# Patient Record
Sex: Female | Born: 1965 | Race: White | Hispanic: No | Marital: Married | Smoking: Never smoker
Health system: Southern US, Community
[De-identification: ages and names within clinical notes are randomized; demographics above are authoritative.]

## PROBLEM LIST (undated history)

## (undated) DIAGNOSIS — R001 Bradycardia, unspecified: Secondary | ICD-10-CM

## (undated) DIAGNOSIS — F419 Anxiety disorder, unspecified: Secondary | ICD-10-CM

## (undated) DIAGNOSIS — F32A Depression, unspecified: Secondary | ICD-10-CM

## (undated) DIAGNOSIS — F329 Major depressive disorder, single episode, unspecified: Secondary | ICD-10-CM

## (undated) HISTORY — PX: DILATION AND CURETTAGE OF UTERUS: SHX78

---

## 2004-10-27 ENCOUNTER — Other Ambulatory Visit: Admission: RE | Admit: 2004-10-27 | Discharge: 2004-10-27 | Payer: Self-pay | Admitting: Obstetrics and Gynecology

## 2005-02-14 ENCOUNTER — Ambulatory Visit (HOSPITAL_COMMUNITY): Admission: RE | Admit: 2005-02-14 | Discharge: 2005-02-14 | Payer: Self-pay | Admitting: Obstetrics and Gynecology

## 2005-02-14 ENCOUNTER — Encounter (INDEPENDENT_AMBULATORY_CARE_PROVIDER_SITE_OTHER): Payer: Self-pay | Admitting: Specialist

## 2006-05-03 ENCOUNTER — Ambulatory Visit (HOSPITAL_COMMUNITY): Admission: RE | Admit: 2006-05-03 | Discharge: 2006-05-03 | Payer: Self-pay | Admitting: Obstetrics and Gynecology

## 2006-05-18 ENCOUNTER — Encounter: Admission: RE | Admit: 2006-05-18 | Discharge: 2006-05-18 | Payer: Self-pay | Admitting: Obstetrics and Gynecology

## 2006-07-13 ENCOUNTER — Ambulatory Visit (HOSPITAL_COMMUNITY): Admission: RE | Admit: 2006-07-13 | Discharge: 2006-07-13 | Payer: Self-pay | Admitting: Obstetrics and Gynecology

## 2006-08-08 ENCOUNTER — Ambulatory Visit: Payer: Self-pay | Admitting: Gastroenterology

## 2006-10-02 ENCOUNTER — Ambulatory Visit: Payer: Self-pay | Admitting: Gastroenterology

## 2007-05-21 ENCOUNTER — Ambulatory Visit (HOSPITAL_COMMUNITY): Admission: RE | Admit: 2007-05-21 | Discharge: 2007-05-21 | Payer: Self-pay | Admitting: Obstetrics and Gynecology

## 2009-03-20 ENCOUNTER — Emergency Department (HOSPITAL_COMMUNITY): Admission: EM | Admit: 2009-03-20 | Discharge: 2009-03-20 | Payer: Self-pay | Admitting: Emergency Medicine

## 2010-04-17 ENCOUNTER — Encounter: Payer: Self-pay | Admitting: Obstetrics and Gynecology

## 2010-06-27 LAB — URINALYSIS, ROUTINE W REFLEX MICROSCOPIC
Hgb urine dipstick: NEGATIVE
Ketones, ur: 15 mg/dL — AB
Leukocytes, UA: NEGATIVE
Nitrite: NEGATIVE
Specific Gravity, Urine: 1.02 (ref 1.005–1.030)
Urobilinogen, UA: 1 mg/dL (ref 0.0–1.0)

## 2010-06-27 LAB — CBC
RBC: 4.43 MIL/uL (ref 3.87–5.11)
RDW: 13 % (ref 11.5–15.5)
WBC: 7.6 10*3/uL (ref 4.0–10.5)

## 2010-06-27 LAB — COMPREHENSIVE METABOLIC PANEL
ALT: 22 U/L (ref 0–35)
Albumin: 4.1 g/dL (ref 3.5–5.2)
BUN: 15 mg/dL (ref 6–23)
CO2: 26 mEq/L (ref 19–32)
Calcium: 8.8 mg/dL (ref 8.4–10.5)
Chloride: 103 mEq/L (ref 96–112)
GFR calc Af Amer: >60 mL/min (ref >60)
GFR calc non Af Amer: >60 mL/min (ref >60)
Glucose, Bld: 110 mg/dL — ABNORMAL HIGH (ref 70–99)
Potassium: 3.6 mEq/L (ref 3.5–5.1)
Sodium: 136 mEq/L (ref 135–145)

## 2010-06-27 LAB — DIFFERENTIAL
Basophils Relative: 1 % (ref 0–1)
Eosinophils Relative: 1 % (ref 0–5)
Lymphs Abs: 0.2 10*3/uL — ABNORMAL LOW (ref 0.7–4.0)
Monocytes Absolute: 0.2 10*3/uL (ref 0.1–1.0)
Monocytes Relative: 2 % — ABNORMAL LOW (ref 3–12)
Neutro Abs: 7.1 10*3/uL (ref 1.7–7.7)

## 2010-06-27 LAB — URINE MICROSCOPIC-ADD ON

## 2010-08-09 NOTE — Assessment & Plan Note (Signed)
[facility]                         GASTROENTEROLOGY OFFICE NOTE   MANDE, AUVIL                          MRN:          [id number]  DATE:08/08/2006                            DOB:          28-Feb-1966    NEW GI PATIENT VISIT   The patient was self-referred.   PRIMARY CARE PHYSICIAN:  Dr. Corliss Blacker at [facility].   CHIEF COMPLAINT:  Chronic GERD, chronic throat-clearing.   HISTORY OF PRESENT ILLNESS:  Ms. Wanat is a very pleasant 45 year old  who has had several years of problems with pyrosis and slight acid  regurgitation.  She feels a burning in her chest that occurs 5 to 6  times a week at least.  SHE also has chronic throat-clearing.  This was  initially an issue 2 to 3 years ago.  She was evaluated by ENT when she  lived in [place], who told her it may be acid related.  She believes she  was on Protonix in the past with some relief.  Currently, she is taking  OTC Prilosec on an as needed basis probably 4 to 5 times a week.  She  has no dysphagia.  No overt gastrointestinal bleeding, and her weight  has been stable.   REVIEW OF SYSTEMS:  Essentially normal and is available on our nursing  intake sheet.   PAST MEDICAL HISTORY:  None.   CURRENT MEDICATIONS:  Zyrtec and Prilosec on a p.r.n. basis.   ALLERGIES:  PENICILLIN.   SOCIAL HISTORY:  Married, 2 children, ages 67 and 47.  Nonsmoker.  Drinks  approximately 3 to 4 cups of cafe beverages a day, as well as peppermint  candy, peppermint gum all day throughout the day.   FAMILY HISTORY:  No colon cancer, colon polyps in the family.   PHYSICAL EXAM:  Height 5 feet 7 inches, 142 pounds.  Blood pressure  100/64, pulse 60.  CONSTITUTIONAL:  Generally well-appearing.  NEUROLOGIC:  Alert and oriented x3.  EYES:  Extraocular movements are intact.  MOUTH:  Oropharynx moist.  No lesions.  NECK:  Supple.  No lymphadenopathy.  CARDIOVASCULAR:  Regular rate and rhythm.  LUNGS:  Clear  to auscultation bilaterally.  ABDOMEN:  Soft, nontender, nondistended.  Normoactive bowel sounds.  EXTREMITIES:  No lower extremity edema.  SKIN:  No rash or lesion on the visible extremities.   ASSESSMENT AND PLAN:  A 45 year old woman with chronic gastroesophageal  reflux disease, excess caffeine and peppermint intake.   I am most struck by the amount of peppermint gums and candies she eats  on a daily basis.  I have recommended she completely stop this for a  week straight.  If she notices no difference in her symptoms, then she  can obviously restart, but at that point she would also change the way  she is taking her over-the-counter Prilosec to 20-30 minutes prior to  her breakfast meal on a daily basis, rather than on an as needed basis  only.  I will also arrange for her to have an EGD performed at her  soonest convenience.  I did not  speak too much with her about caffeine  as a possible cause, but I am giving her a handout and we will discuss  that at the time of her EGD.     Rachael Fee, MD  Electronically Signed    DPJ/MedQ  DD: 08/08/2006  DT: 08/08/2006  Job #: [id number]   cc:   Pam Drown, M.D.

## 2010-08-12 NOTE — H&P (Signed)
NAME:  Nancy Walker, Nancy Walker NO.:  [id number]   MEDICAL RECORD NO.:  [id number]          PATIENT TYPE:  AMB   LOCATION:  SDC                           F[facility]ILITY:  [facility]   PHYSICIAN:  Huel Cote, M.D. DATE OF BIRTH:  [birth year]   DATE OF ADMISSION:  02/14/2005  DATE OF DISCHARGE:                                HISTORY & PHYSICAL   The patient is a 45 year old G2, P2, who is coming in for a laparoscopic  left cystectomy versus oophorectomy for a complex lesion which has been  present for approximately one year.  The patient has not had significant  symptoms from this; however, ultrasound follow-up over one year has shown no  resolution of this complex partially-solid lesion and given her age  approaching 53 and the possibility of defining this lesion which has grown  slightly throughout time, it was felt to be prudent to remove it for  pathologic diagnosis.   PAST MEDICAL HISTORY:  None.   PAST SURGICAL HISTORY:  C-section x2, last with a tubal ligation.   ALLERGIES:  PENICILLIN.   PAST OBSTETRICAL HISTORY:  C-section x2.   PAST GYNECOLOGIC HISTORY:  No abnormal Pap smears.   CURRENT MEDICATIONS:  Lo-Ovral and Avahist.   FAMILY HISTORY:  Significant for breast cancer in a maternal great-aunt.  There is no history of ovarian cancer.   PHYSICAL EXAMINATION:  VITAL SIGNS:  Her height is 5 feet 7 inches, weight  is 138 pounds.  Blood pressure 92/70.  CARDIAC:  Regular rate and rhythm.  LUNGS:  Clear.  ABDOMEN:  Soft and nontender.  PELVIC:  Her cervix has no lesions.  Uterus is normal in size, and the  adnexa have no palpable masses.   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  There is a very small percentage of these which  could have immature components, and this was discussed with the patient and  she was given the  option of expectant management versus removal and elects  for removal.  The risks and benefits of surgery were discussed in detail  with the patient, including bleeding, infection and possible damage to bowel  and bladder.  She understands that she may  need an oophorectomy should the lesion not be clearly identifiable to be  removed by cystectomy and has no problem with a possible oophorectomy on  that side.  We also discussed the incisions and procedure in detail, and she  is desirous of proceeding.      Huel Cote, M.D.  Electronically Signed     KR/MEDQ  D:  02/09/2005  T:  02/09/2005  Job:  [id number]

## 2010-08-12 NOTE — Op Note (Signed)
NAME:  Nancy Walker, Nancy Walker                 ACCOUNT NO.:  [id number]   MEDICAL RECORD NO.:  [id number]          PATIENT TYPE:  AMB   LOCATION:  SDC                           [facility]:  [facility]   PHYSICIAN:  Sherron Monday, MD        DATE OF BIRTH:  03-14-1966   DATE OF PROCEDURE:  07/13/2006  DATE OF DISCHARGE:                               OPERATIVE REPORT   PREOPERATIVE DIAGNOSIS:  Menorrhagia.   POSTOPERATIVE DIAGNOSIS:  Menorrhagia.   PROCEDURE:  NovaSure endometrial ablation.   SURGEON:  Sherron Monday, M.D.   ASSISTANT:  None.   ANESTHESIA:  General.   ESTIMATED BLOOD LOSS:  Minimal.   INTRAVENOUS FLUIDS:  1000 mL.   URINE OUTPUT:  By I&O catheterization prior to the procedure.   COMPLICATIONS:  None.   SPECIMENS:  None.   FINDINGS:  Uterus sounds to 8 cm, cervical length found to be 3 cm and  the width of the cavity 3.3 cm, and the NovaSure power was at 91 watts,  and her time was 1 minute and 54 seconds.   PROCEDURE:  Risks, benefits and alternatives of the surgical procedure  were discussed with the patient. She was seen and examined in the  office. She was transported to the operating room, placed on a table in  supine position. General anesthesia was induced and found to be  adequate. She was placed in the yellow-fin stirrups, then prepped and  draped in the normal sterile fashion. Using an open-sided speculum, her  cervix was easily identified using a single-tooth tenaculum. Cervix was  grasped. Uterus was sounded to approximately 8 cm. It was noted to be  retroverted, dilated to accommodate the NovaSure ablation device. The  cervical length was found to be 3 cm after placement of the device. The  width of the cavity was found to be 3.25 cm. The test of cavity  integrity was performed without difficulty. The ablation was then  performed 91 watts for approximately 1 minute and 54 seconds. She  tolerated the procedure well. The device was removed from her cervix.  The  single-toothed tenaculum was also removed. Using pressure,  hemostasis was obtained at the tenaculum site. Sponge, lap and needle  counts were correct x2 per the operating room staff. The patient was  awakened and taken in stable condition to the PACU.      Sherron Monday, MD  Electronically Signed     JB/MEDQ  D:  07/13/2006  T:  07/13/2006  Job:  [id number]

## 2010-08-12 NOTE — H&P (Signed)
NAME:  Nancy Walker, Nancy Walker                 ACCOUNT NO.:  [id number]   MEDICAL RECORD NO.:  [id number]          PATIENT TYPE:  AMB   LOCATION:  SDC                           [facility]   PHYSICIAN:  Sherron Monday, MD        DATE OF BIRTH:  09-Apr-1965   DATE OF ADMISSION:  07/13/2006  DATE OF DISCHARGE:                              HISTORY & PHYSICAL   ADMISSION PROCEDURE:  NovaSure endometrial ablation.   HISTORY OF PRESENT ILLNESS:  A 45 year old Caucasian female, G2 P2-0-0-2  with history of heavy menses who was interested in the NovaSure  ablation.  In our office, she had endometrial biopsy and saline infused  histogram revealing a normal cavity and the endometrial biopsy was  benign.   PAST MEDICAL HISTORY:  Significant for migraines.   PAST SURGICAL HISTORY:  Significant for left salpingo-oophorectomy  secondary to a dermoid tumor as well as two C sections as well as a  bilateral tubal ligation.   PAST OBSTETRICAL HISTORY:  She is a G2 P2-0-0-2 with two term cesarean  sections, the first of which was for failure to progress and the second  for repeat.  She has no history of abnormal pap smears and her last one  was in January 2008 and was within normal limits.  She has never had any  sexually transmitted diseases.   MEDICATIONS:  Protonix.   ALLERGIES:  No known drug allergies.   SOCIAL HISTORY:  She denies tobacco use. She has occasional alcohol use  and no drugs. She is married and works in [employer].   FAMILY HISTORY:  Significant for mother with Cashe's disease, no  hypertension, diabetes or cancer and no coronary artery disease.   PHYSICAL EXAMINATION:  VITAL SIGNS:  Afebrile.  Vital signs are stable.  She is approximately 5 feet 7 inches tall, weighs 143 pounds.  GENERAL:  She is in no distress.  CARDIOVASCULAR: Regular rate and rhythm.  LUNGS;  Clear to auscultation bilaterally.  NECK:  Without lymphadenopathy.  HEENT: Mucous membranes are moist. Thyroid is within  normal limits.  ABDOMEN:  Soft, nontender, nondistended, good bowel sounds.  EXTREMITIES:  Symmetric and nontender.  BREASTS:  No masses and nontender. No distortion.  BACK:  No costovertebral angle tenderness.  PELVIC:  Normal external female genitalia, normal BUS.  Cervix and  vagina without lesions.  No cervical motion tenderness, normal uterus  and ovaries on bilateral examination.   ASSESSMENT AND PLAN:  After the preoperative workup, we again discussed  the risks, benefits and alternatives of the surgical procedure. The  patient wishes to proceed.  Her questions are answered.  We will proceed  with a NovaSure endometrial ablation on July 13, 2006.      Sherron Monday, MD  Electronically Signed     JB/MEDQ  D:  07/12/2006  T:  07/12/2006  Job:  [id number]

## 2010-08-12 NOTE — Op Note (Signed)
NAME:  Nancy Walker, Nancy Walker NO.:  [id number]   MEDICAL RECORD NO.:  [id number]          PATIENT TYPE:  AMB   LOCATION:  SDC                           [facility]   PHYSICIAN:  Huel Cote, M.D. DATE OF BIRTH:  December 27, 1965   DATE OF PROCEDURE:  02/14/2005  DATE OF DISCHARGE:                                 OPERATIVE REPORT   PREOPERATIVE DIAGNOSIS:  Left complex ovarian mass, approximately 2 cm.   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   PROCEDURE:  Left salpingo-oophorectomy, laparoscopic lysis of anterior  uterine adhesion.   SURGEON:  Huel Cote, M.D.   ASSISTANT:  Zenaida Niece, M.D.   ANESTHESIA:  General.   SPECIMEN:  Left tube and ovary.   ESTIMATED BLOOD LOSS:  Minimal.   COMPLICATIONS:  None.   FINDINGS:  Left ovary was enlarged without a visible dermoid or mass noted  exteriorly.  The mass itself was embedded within normal ovarian tissue and  therefore could not be identified on the surface  The right ovary was  normal.  There was an anterior uterine adhesion, which was quite thick, to  the anterior abdominal wall.   PROCEDURE:  The patient was taken to the operating room, where general  anesthesia was obtained without difficulty.  She was then prepped and draped  in the normal sterile fashion in dorsal lithotomy position with a Hulka  tenaculum placed within the vagina on the cervix for uterine manipulation  and the bladder emptied.  Attention was then turned to the patient's  abdomen, where small infraumbilical incision was made with a scalpel  approximately 1.5 cm in length.  The Veress needle was then placed within  the incision easily and pneumoperitoneum obtained with approximately 1.5 L  of CO2 gas.  The Veress needle was then removed and the 10/11 trocar placed  within the incision without difficulty.  The camera was then introduced  and  the abdomen and pelvis were closely inspected.  Two additional sites were  then placed with 5-mm ports in both the right and lower left quadrant.  All  incision sites were injected with 0.5% Marcaine prior to placing the  trocars, and these were done under direct visualization with camera with  careful attention to avoid any vessels.  The uterus and ovaries were  inspected with the findings as previously stated, and the left adnexa was  then grasped and reflected medially with the infundibulopelvic ligament  easily exposed.  The infundibulopelvic ligament was then taken down with the  Ace harmonic scalpel and then taken across the uterine-ovarian ligaments to  totally remove the tube and ovary.  This area appeared hemostatic.  Therefore, attention was then turned to the anterior thick adhesion, which  ran from the anterior fundus to the abdominal wall.  This was taken down  with the scalpel as well with good hemostasis noted.  The right ovary and  tube appeared normal.  The liver edge and gallbladder appeared normal.  There was no active bleeding.  Therefore the camera was changed to the 5 mm  port site and the Endobag was introduced through the 10/11 trocar.  The left  tube and adnexa, which had been separated, were then placed into the Endobag  and removed through the 10/11 the trocar site.  This was then handed off for  frozen pathology given that there was no obvious answer to the question of  what pathology was present on physical exam.  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  At this point all instruments  and sponges were removed from the abdomen.  The trocars were removed under  direct visualization without any active bleeding noted intra-abdominally.  The trocar sites were then closed with 0 Vicryl in subcutaneous suture.  There was a small amount bleeding at the right 5 mm trocar site.  However,  this was controlled with a suture ligature  of subcutaneous tissue.  The skin  was then closed with 3-0 Vicryl in a subcuticular stitch.  The sponge, lap  and needle counts were correct x2 and the patient was taken to the recovery  room in stable condition.      Huel Cote, M.D.  Electronically Signed     KR/MEDQ  D:  02/14/2005  T:  02/14/2005  Job:  [id number]

## 2011-01-30 ENCOUNTER — Other Ambulatory Visit (HOSPITAL_COMMUNITY): Payer: Self-pay | Admitting: Obstetrics and Gynecology

## 2011-01-30 DIAGNOSIS — Z1231 Encounter for screening mammogram for malignant neoplasm of breast: Secondary | ICD-10-CM

## 2011-02-27 ENCOUNTER — Ambulatory Visit (HOSPITAL_COMMUNITY)
Admission: RE | Admit: 2011-02-27 | Discharge: 2011-02-27 | Disposition: A | Discharge status: Home / Self Care | Payer: 59 | Source: Ambulatory Visit | Attending: Obstetrics and Gynecology | Admitting: Obstetrics and Gynecology

## 2011-02-27 DIAGNOSIS — Z1231 Encounter for screening mammogram for malignant neoplasm of breast: Secondary | ICD-10-CM | POA: Insufficient documentation

## 2012-09-10 ENCOUNTER — Other Ambulatory Visit (HOSPITAL_COMMUNITY): Payer: Self-pay | Admitting: Obstetrics and Gynecology

## 2012-09-10 DIAGNOSIS — Z1231 Encounter for screening mammogram for malignant neoplasm of breast: Secondary | ICD-10-CM

## 2012-09-10 DIAGNOSIS — Z780 Asymptomatic menopausal state: Secondary | ICD-10-CM

## 2012-10-08 ENCOUNTER — Ambulatory Visit (HOSPITAL_COMMUNITY)
Admission: RE | Admit: 2012-10-08 | Discharge: 2012-10-08 | Disposition: A | Discharge status: Home / Self Care | Payer: BC Managed Care – PPO | Source: Ambulatory Visit | Attending: Obstetrics and Gynecology | Admitting: Obstetrics and Gynecology

## 2012-10-08 DIAGNOSIS — Z780 Asymptomatic menopausal state: Secondary | ICD-10-CM

## 2012-10-08 DIAGNOSIS — Z1231 Encounter for screening mammogram for malignant neoplasm of breast: Secondary | ICD-10-CM

## 2012-10-08 DIAGNOSIS — Z13820 Encounter for screening for osteoporosis: Secondary | ICD-10-CM | POA: Insufficient documentation

## 2013-11-18 ENCOUNTER — Other Ambulatory Visit (HOSPITAL_COMMUNITY): Payer: Self-pay | Admitting: Obstetrics and Gynecology

## 2013-11-18 DIAGNOSIS — Z1231 Encounter for screening mammogram for malignant neoplasm of breast: Secondary | ICD-10-CM

## 2013-11-27 ENCOUNTER — Ambulatory Visit (HOSPITAL_COMMUNITY)
Admission: RE | Admit: 2013-11-27 | Discharge: 2013-11-27 | Disposition: A | Discharge status: Home / Self Care | Payer: BC Managed Care – PPO | Source: Ambulatory Visit | Attending: Obstetrics and Gynecology | Admitting: Obstetrics and Gynecology

## 2013-11-27 DIAGNOSIS — Z1231 Encounter for screening mammogram for malignant neoplasm of breast: Secondary | ICD-10-CM | POA: Insufficient documentation

## 2014-12-15 ENCOUNTER — Other Ambulatory Visit (HOSPITAL_COMMUNITY): Payer: Self-pay | Admitting: Obstetrics and Gynecology

## 2014-12-15 DIAGNOSIS — Z1231 Encounter for screening mammogram for malignant neoplasm of breast: Secondary | ICD-10-CM

## 2014-12-17 ENCOUNTER — Ambulatory Visit (HOSPITAL_COMMUNITY): Payer: Self-pay

## 2014-12-23 ENCOUNTER — Ambulatory Visit (HOSPITAL_COMMUNITY)
Admission: RE | Admit: 2014-12-23 | Discharge: 2014-12-23 | Disposition: A | Discharge status: Home / Self Care | Payer: PRIVATE HEALTH INSURANCE | Source: Ambulatory Visit | Attending: Obstetrics and Gynecology | Admitting: Obstetrics and Gynecology

## 2014-12-23 DIAGNOSIS — Z1231 Encounter for screening mammogram for malignant neoplasm of breast: Secondary | ICD-10-CM | POA: Diagnosis not present

## 2015-06-17 ENCOUNTER — Other Ambulatory Visit: Payer: Self-pay | Admitting: Obstetrics and Gynecology

## 2015-06-17 DIAGNOSIS — M810 Age-related osteoporosis without current pathological fracture: Secondary | ICD-10-CM

## 2015-07-16 ENCOUNTER — Other Ambulatory Visit: Payer: PRIVATE HEALTH INSURANCE

## 2015-08-03 ENCOUNTER — Ambulatory Visit
Admission: RE | Admit: 2015-08-03 | Discharge: 2015-08-03 | Disposition: A | Discharge status: Home / Self Care | Payer: 59 | Source: Ambulatory Visit | Attending: Obstetrics and Gynecology | Admitting: Obstetrics and Gynecology

## 2015-08-03 DIAGNOSIS — M810 Age-related osteoporosis without current pathological fracture: Secondary | ICD-10-CM

## 2016-06-20 ENCOUNTER — Encounter: Payer: Self-pay | Admitting: Gastroenterology

## 2016-07-20 ENCOUNTER — Emergency Department (HOSPITAL_COMMUNITY): Payer: BLUE CROSS/BLUE SHIELD

## 2016-07-20 ENCOUNTER — Inpatient Hospital Stay (HOSPITAL_COMMUNITY)
Admission: EM | Admit: 2016-07-20 | Discharge: 2016-07-26 | DRG: 084 | Disposition: A | Discharge status: Home Health | Payer: BLUE CROSS/BLUE SHIELD | Attending: Neurosurgery | Admitting: Neurosurgery

## 2016-07-20 ENCOUNTER — Encounter (HOSPITAL_COMMUNITY): Payer: Self-pay | Admitting: Emergency Medicine

## 2016-07-20 DIAGNOSIS — S0291XA Unspecified fracture of skull, initial encounter for closed fracture: Secondary | ICD-10-CM

## 2016-07-20 DIAGNOSIS — F329 Major depressive disorder, single episode, unspecified: Secondary | ICD-10-CM | POA: Diagnosis present

## 2016-07-20 DIAGNOSIS — W109XXA Fall (on) (from) unspecified stairs and steps, initial encounter: Secondary | ICD-10-CM | POA: Diagnosis present

## 2016-07-20 DIAGNOSIS — S06310A Contusion and laceration of right cerebrum without loss of consciousness, initial encounter: Secondary | ICD-10-CM

## 2016-07-20 DIAGNOSIS — F419 Anxiety disorder, unspecified: Secondary | ICD-10-CM | POA: Diagnosis present

## 2016-07-20 DIAGNOSIS — S065X9A Traumatic subdural hemorrhage with loss of consciousness of unspecified duration, initial encounter: Secondary | ICD-10-CM | POA: Diagnosis present

## 2016-07-20 DIAGNOSIS — R402253 Coma scale, best verbal response, oriented, at hospital admission: Secondary | ICD-10-CM | POA: Diagnosis present

## 2016-07-20 DIAGNOSIS — Z91048 Other nonmedicinal substance allergy status: Secondary | ICD-10-CM | POA: Diagnosis not present

## 2016-07-20 DIAGNOSIS — S062X9A Diffuse traumatic brain injury with loss of consciousness of unspecified duration, initial encounter: Principal | ICD-10-CM | POA: Diagnosis present

## 2016-07-20 DIAGNOSIS — R402363 Coma scale, best motor response, obeys commands, at hospital admission: Secondary | ICD-10-CM | POA: Diagnosis present

## 2016-07-20 DIAGNOSIS — S0219XA Other fracture of base of skull, initial encounter for closed fracture: Secondary | ICD-10-CM | POA: Diagnosis present

## 2016-07-20 DIAGNOSIS — Z91040 Latex allergy status: Secondary | ICD-10-CM | POA: Diagnosis not present

## 2016-07-20 DIAGNOSIS — R4182 Altered mental status, unspecified: Secondary | ICD-10-CM | POA: Diagnosis present

## 2016-07-20 DIAGNOSIS — R402133 Coma scale, eyes open, to sound, at hospital admission: Secondary | ICD-10-CM | POA: Diagnosis present

## 2016-07-20 DIAGNOSIS — R9431 Abnormal electrocardiogram [ECG] [EKG]: Secondary | ICD-10-CM | POA: Diagnosis not present

## 2016-07-20 DIAGNOSIS — S06339A Contusion and laceration of cerebrum, unspecified, with loss of consciousness of unspecified duration, initial encounter: Secondary | ICD-10-CM | POA: Diagnosis present

## 2016-07-20 DIAGNOSIS — Z880 Allergy status to penicillin: Secondary | ICD-10-CM

## 2016-07-20 DIAGNOSIS — I619 Nontraumatic intracerebral hemorrhage, unspecified: Secondary | ICD-10-CM

## 2016-07-20 DIAGNOSIS — I629 Nontraumatic intracranial hemorrhage, unspecified: Secondary | ICD-10-CM

## 2016-07-20 DIAGNOSIS — R001 Bradycardia, unspecified: Secondary | ICD-10-CM | POA: Diagnosis present

## 2016-07-20 DIAGNOSIS — S06310S Contusion and laceration of right cerebrum without loss of consciousness, sequela: Secondary | ICD-10-CM | POA: Diagnosis not present

## 2016-07-20 DIAGNOSIS — S0633AA Contusion and laceration of cerebrum, unspecified, with loss of consciousness status unknown, initial encounter: Secondary | ICD-10-CM | POA: Diagnosis present

## 2016-07-20 HISTORY — DX: Anxiety disorder, unspecified: F41.9

## 2016-07-20 HISTORY — DX: Major depressive disorder, single episode, unspecified: F32.9

## 2016-07-20 HISTORY — DX: Depression, unspecified: F32.A

## 2016-07-20 LAB — CBC WITH DIFFERENTIAL/PLATELET
Basophils Absolute: 0 10*3/uL (ref 0.0–0.1)
Basophils Relative: 0 %
Eosinophils Absolute: 0 10*3/uL (ref 0.0–0.7)
Eosinophils Relative: 0 %
HEMATOCRIT: 39.3 % (ref 36.0–46.0)
HEMOGLOBIN: 13.4 g/dL (ref 12.0–15.0)
LYMPHS PCT: 11 %
Lymphs Abs: 0.8 10*3/uL (ref 0.7–4.0)
MCH: 30 pg (ref 26.0–34.0)
MCHC: 34.1 g/dL (ref 30.0–36.0)
MCV: 87.9 fL (ref 78.0–100.0)
Monocytes Absolute: 0.7 10*3/uL (ref 0.1–1.0)
Monocytes Relative: 10 %
NEUTROS ABS: 5.5 10*3/uL (ref 1.7–7.7)
NEUTROS PCT: 79 %
Platelets: 160 10*3/uL (ref 150–400)
RBC: 4.47 MIL/uL (ref 3.87–5.11)
RDW: 13.6 % (ref 11.5–15.5)
WBC: 7 10*3/uL (ref 4.0–10.5)

## 2016-07-20 LAB — BASIC METABOLIC PANEL
Anion gap: 10 (ref 5–15)
Anion gap: 8 (ref 5–15)
BUN: 22 mg/dL — AB (ref 6–20)
BUN: 20 mg/dL (ref 6–20)
CHLORIDE: 102 mmol/L (ref 101–111)
CO2: 28 mmol/L (ref 22–32)
CO2: 25 mmol/L (ref 22–32)
CREATININE: 0.68 mg/dL (ref 0.44–1.00)
Calcium: 9.3 mg/dL (ref 8.9–10.3)
Calcium: 8.6 mg/dL — ABNORMAL LOW (ref 8.9–10.3)
Chloride: 106 mmol/L (ref 101–111)
Creatinine, Ser: 0.6 mg/dL (ref 0.44–1.00)
GFR calc Af Amer: >60 mL/min (ref >60)
GFR calc Af Amer: >60 mL/min (ref >60)
GFR calc non Af Amer: >60 mL/min (ref >60)
GFR calc non Af Amer: >60 mL/min (ref >60)
Glucose, Bld: 126 mg/dL — ABNORMAL HIGH (ref 65–99)
Glucose, Bld: 111 mg/dL — ABNORMAL HIGH (ref 65–99)
Potassium: 3.8 mmol/L (ref 3.5–5.1)
Potassium: 4.1 mmol/L (ref 3.5–5.1)
SODIUM: 140 mmol/L (ref 135–145)
Sodium: 139 mmol/L (ref 135–145)

## 2016-07-20 LAB — MRSA PCR SCREENING: MRSA BY PCR: NEGATIVE

## 2016-07-20 LAB — CBC
HCT: 37.9 % (ref 36.0–46.0)
Hemoglobin: 12.5 g/dL (ref 12.0–15.0)
MCH: 29.4 pg (ref 26.0–34.0)
MCHC: 33 g/dL (ref 30.0–36.0)
MCV: 89.2 fL (ref 78.0–100.0)
Platelets: 157 10*3/uL (ref 150–400)
RBC: 4.25 MIL/uL (ref 3.87–5.11)
RDW: 13.4 % (ref 11.5–15.5)
WBC: 6.4 10*3/uL (ref 4.0–10.5)

## 2016-07-20 LAB — CBG MONITORING, ED: GLUCOSE-CAPILLARY: 119 mg/dL — AB (ref 65–99)

## 2016-07-20 LAB — I-STAT CG4 LACTIC ACID, ED
LACTIC ACID, VENOUS: 1.14 mmol/L (ref 0.5–1.9)
Lactic Acid, Venous: 1.13 mmol/L (ref 0.5–1.9)

## 2016-07-20 MED ORDER — ONDANSETRON HCL 4 MG/2ML IJ SOLN
4.0000 mg | Freq: Four times a day (QID) | INTRAMUSCULAR | Status: DC | PRN
Start: 2016-07-20 — End: 2016-07-26

## 2016-07-20 MED ORDER — ACETAMINOPHEN 650 MG RE SUPP: 650.0000 mg | Freq: Four times a day (QID) | RECTAL | Status: DC | PRN

## 2016-07-20 MED ORDER — MAGNESIUM CITRATE PO SOLN: 1.0000 | Freq: Once | ORAL | Status: DC | PRN

## 2016-07-20 MED ORDER — SODIUM CHLORIDE 0.9 % IV SOLN
INTRAVENOUS | Status: DC
  Administered 2016-07-21 – 2016-07-25 (×4): via INTRAVENOUS

## 2016-07-20 MED ORDER — DOCUSATE SODIUM 100 MG PO CAPS
100.0000 mg | ORAL_CAPSULE | Freq: Two times a day (BID) | ORAL | Status: DC
  Administered 2016-07-20 – 2016-07-25 (×5): 100 mg via ORAL
  Filled 2016-07-20 (×9): qty 1

## 2016-07-20 MED ORDER — BISACODYL 5 MG PO TBEC
5.0000 mg | DELAYED_RELEASE_TABLET | Freq: Every day | ORAL | Status: DC | PRN
  Filled 2016-07-20: qty 1

## 2016-07-20 MED ORDER — SODIUM CHLORIDE 0.9 % IV SOLN
500.0000 mg | Freq: Two times a day (BID) | INTRAVENOUS | Status: DC
  Administered 2016-07-20 – 2016-07-22 (×4): 500 mg via INTRAVENOUS
  Filled 2016-07-20 (×5): qty 5

## 2016-07-20 MED ORDER — SENNOSIDES-DOCUSATE SODIUM 8.6-50 MG PO TABS: 1.0000 | ORAL_TABLET | Freq: Every evening | ORAL | Status: DC | PRN

## 2016-07-20 MED ORDER — ACETAMINOPHEN 325 MG PO TABS
650.0000 mg | ORAL_TABLET | Freq: Four times a day (QID) | ORAL | Status: DC | PRN
  Administered 2016-07-23: 650 mg via ORAL
  Filled 2016-07-20 (×2): qty 2

## 2016-07-20 MED ORDER — ONDANSETRON HCL 4 MG PO TABS: 4.0000 mg | ORAL_TABLET | Freq: Four times a day (QID) | ORAL | Status: DC | PRN

## 2016-07-20 MED ORDER — HYDROCODONE-ACETAMINOPHEN 5-325 MG PO TABS: 1.0000 | ORAL_TABLET | ORAL | Status: DC | PRN

## 2016-07-20 MED ORDER — SODIUM CHLORIDE 0.9 % IV BOLUS (SEPSIS)
1000.0000 mL | Freq: Once | INTRAVENOUS | Status: AC
  Administered 2016-07-20: 1000 mL via INTRAVENOUS

## 2016-07-20 NOTE — H&P (Signed)
CC:  Chief Complaint  Patient presents with  . Loss of Consciousness  . Head Injury    HPI: Nancy Walker is a 51 y.o. female who was brought to ER earlier today by family due to increased fatigue after a fall Wednesday am. Husband present in room giving history. Reportedly she was "coming down with a cold" but she felt weak and fell hitting head. Since the fall, she has been sleeping throughout the day.  She cannot recall the fall, but remembers the events surrounding the event. Pt reports minimal frontal headache and generalized weakness. Denies dizziness, motor/sensory deficits.  PMH: History reviewed. No pertinent past medical history.  PSH: Past Surgical History:  Procedure Laterality Date  . CESAREAN SECTION      SH: Social History  Substance Use Topics  . Smoking status: Never Smoker  . Smokeless tobacco: Not on file  . Alcohol use No    MEDS: Prior to Admission medications   Not on File    ALLERGY: Allergies  Allergen Reactions  . Latex   . Penicillins   . Steri-Strip Compound Benzoin [Benzoin Compound]     ROS: Review of Systems  Constitutional: Negative for chills and fever.  HENT: Negative.  Negative for congestion, ear discharge, ear pain, hearing loss, nosebleeds, sinus pain, sore throat and tinnitus.   Eyes: Negative.   Respiratory: Positive for cough. Negative for stridor.   Cardiovascular: Negative.   Gastrointestinal: Positive for nausea (intermittently).  Genitourinary: Negative.   Musculoskeletal: Negative.   Skin: Negative.   Neurological: Positive for headaches. Negative for dizziness, tingling, tremors, sensory change, speech change, focal weakness, seizures and loss of consciousness.  Endo/Heme/Allergies: Negative.   Psychiatric/Behavioral: Negative.     Vitals:   07/20/16 1258 07/20/16 1303  BP:  (!) 93/53  Pulse:  61  Resp:  16  Temp: 98 F (36.7 C)    General appearance: Drowsy but easily awakened, NAD, WDWN Eyes:  PERRL Cardiovascular: Regular rate and rhythm without murmurs, rubs, gallops. No edema or variciosities. Distal pulses normal. Pulmonary: Clear to auscultation Musculoskeletal:     Muscle tone upper extremities: Normal    Muscle tone lower extremities: Normal    Motor exam: Upper Extremities Deltoid Bicep Tricep Grip  Right 5/5 5/5 5/5 5/5  Left 5/5 5/5 5/5 5/5   Lower Extremity IP Quad PF DF EHL  Right 5/5 5/5 5/5 5/5 5/5  Left 5/5 5/5 5/5 5/5 5/5   Neurological Oriented  Speech fluent, appropriate CNII: Visual fields normal CNIII/IV/VI: EOMI CNV: Facial sensation normal CNVII: Symmetric, normal strength CNVIII: Grossly normal CNIX: Normal palate movement CNXI: Trap and SCM strength normal CN XII: Tongue protrusion normal Sensation grossly intact to LT DTR: Normal  IMAGING: CT HEAD IMPRESSION: 1. Hemorrhagic contusions in the right frontal greater than right temporal lobes. Small amount of associated subdural and likely subarachnoid hemorrhage. 2. Nondisplaced left temporal bone fracture. Diastasis of the left lambdoid suture with small minimally depressed bone fragment. 3. Associated small left temporal epidural hematoma. 4. Thin subdural hematoma over the left cerebral convexity.  IMPRESSION: - 51 y.o. female with multiple lobe contusions, small amount of SDH, nondisplaced left temporal bone fracture s/p fall. She is drowsy but easily awakened and otherwise neurologically intact. No surgical intervention indicated. These injuries should heal over time. At this time, rec continue monitoring with neuro exams q 1 hour. No need for repeat CT scan based on injury roughly 36 hours ago unless her exam changes. Start Keppra for seizure prophylaxis  500mg  BID x7days.

## 2016-07-20 NOTE — ED Notes (Signed)
Notified [facility] for transfer.  Approx 1 hour for pick up.  MD aware and approved delay.

## 2016-07-20 NOTE — ED Provider Notes (Signed)
[facility] DEPT Provider Note   CSN: [id number] Arrival date & time: 07/20/16  1246     History   Chief Complaint Chief Complaint  Patient presents with  . Loss of Consciousness  . Head Injury    HPI Nancy Walker is a 51 y.o. female.  HPI Pt comes in with cc of  ams. PT has no medical hx. Husband reports that pt has been having URI for the past couple of days and feeling weak. Titus Dubin, she had a fall, and since then she has been more sleepy, so he decided to bring her to the ER. Pt reports that she fell because she was weak and lost balance. She has mild headache. Pt denies associated nausea, vomiting, seizures, loss of consciousness or new visual complains, numbness, dizziness. Pt feels that she is just weak all over.  History reviewed. No pertinent past medical history.  Patient Active Problem List   Diagnosis Date Noted  . Frontal lobe contusion ([place]) 07/20/2016    Past Surgical History:  Procedure Laterality Date  . CESAREAN SECTION      OB History    No data available       Home Medications    Prior to Admission medications   Medication Sig Start Date End Date Taking? Authorizing Provider  sertraline (ZOLOFT) 100 MG tablet Take 200 mg by mouth daily. 05/13/16  Yes Historical Provider, MD    Family History No family history on file.  Social History Social History  Substance Use Topics  . Smoking status: Never Smoker  . Smokeless tobacco: Not on file  . Alcohol use No     Allergies   Latex; Penicillins; and Steri-strip compound benzoin [benzoin compound]   Review of Systems Review of Systems  Constitutional: Positive for fatigue.  Hematological: Does not bruise/bleed easily.  All other systems reviewed and are negative.    Physical Exam Updated Vital Signs BP (!) 103/50   Pulse 66   Temp 98 F (36.7 C) (Oral)   Resp 18   SpO2 98%   Physical Exam  Constitutional: She is oriented to person, place, and time. She appears well-developed.    HENT:  Head: Normocephalic and atraumatic.  No nystagmus  Eyes: Conjunctivae and EOM are normal. Pupils are equal, round, and reactive to light.  Neck: Normal range of motion. Neck supple.  Cardiovascular: Normal rate, regular rhythm and normal heart sounds.   Pulmonary/Chest: Effort normal and breath sounds normal. No respiratory distress.  Abdominal: Soft. Bowel sounds are normal. She exhibits no distension. There is no tenderness. There is no rebound and no guarding.  Neurological: She is oriented to person, place, and time. No cranial nerve deficit. Coordination normal.  Cerebellar exam is normal (finger to nose) Sensory exam normal for bilateral upper and lower extremities - and patient is able to discriminate between sharp and dull. Motor exam is 4+/5 for upper and lower extremity Pt is somnolent, but arosuable   Skin: Skin is warm and dry.  Nursing note and vitals reviewed.    ED Treatments / Results  Labs (all labs ordered are listed, but only abnormal results are displayed) Labs Reviewed  BASIC METABOLIC PANEL - Abnormal; Notable for the following:       Result Value   Glucose, Bld 126 (*)    BUN 22 (*)    All other components within normal limits  BASIC METABOLIC PANEL - Abnormal; Notable for the following:    Glucose, Bld 111 (*)    Calcium  8.6 (*)    All other components within normal limits  CBG MONITORING, ED - Abnormal; Notable for the following:    Glucose-Capillary 119 (*)    All other components within normal limits  CBC WITH DIFFERENTIAL/PLATELET  CBC  URINALYSIS, ROUTINE W REFLEX MICROSCOPIC  HIV ANTIBODY (ROUTINE TESTING)  I-STAT CG4 LACTIC ACID, ED  I-STAT CG4 LACTIC ACID, ED    EKG  EKG Interpretation  Date/Time:  Thursday July 20 2016 13:06:25 EDT Ventricular Rate:  57 PR Interval:    QRS Duration: 78 QT Interval:  410 QTC Calculation: 400 R Axis:   58 Text Interpretation:  Sinus rhythm No acute changes No old tracing to compare  Confirmed by Kathrynn Humble, MD, Thelma Comp [phone]) on 07/20/2016 2:49:48 PM       Radiology Dg Chest 2 View  Result Date: 07/20/2016 CLINICAL DATA:  Cough, syncope, fatigue EXAM: CHEST  2 VIEW COMPARISON:  None. FINDINGS: No active infiltrate or effusion is seen. Mediastinal and hilar contours are unremarkable. The heart is borderline enlarged. No bony abnormality is seen. IMPRESSION: No active cardiopulmonary disease. Electronically Signed   By: Ivar Drape M.D.   On: 07/20/2016 14:20   Ct Head Wo Contrast  Result Date: 07/20/2016 CLINICAL DATA:  Altered mental status. Headache. Fall. Initial encounter. EXAM: CT HEAD WITHOUT CONTRAST TECHNIQUE: Contiguous axial images were obtained from the base of the skull through the vertex without intravenous contrast. COMPARISON:  None. FINDINGS: Brain: There are multiple foci of hemorrhage in the anteroinferior right frontal lobe extending over an area of approximately 5 cm with mild-to-moderate surrounding edema most compatible with hemorrhagic contusions. A small hemorrhagic contusion is noted anteriorly in the right temporal lobe. Small volume extra-axial hemorrhage along the floor of the anterior cranial fossa on the right measures up to 4 mm in thickness and is likely subdural. There may be a small amount of subarachnoid hemorrhage in this region as well, and there is also a very small amount of extra-axial hemorrhage anteriorly in the right middle cranial fossa. A small extra-axial hematoma at the level of the posterior left temporal lobe measure 6 mm in thickness and appears epidural in location within overlying fracture noted. A thin extra-axial collection more diffusely over the left cerebral convexity contains hyperattenuating blood products posteriorly measuring up to 3 mm. There is trace pneumocephalus. There is no midline shift. The ventricles are normal in size. Vascular: No hyperdense vessel. Skull: Nondisplaced fracture involving the squamous cell portion of  the left temporal bone. There is diastases of the left lambdoid suture with a small osseous fragment more supero medially along the suture demonstrating 1 mm in more displacement (series 3, image 30). Sinuses/Orbits: Visualized orbits are unremarkable. Visualized paranasal sinuses and mastoid air cells are clear. Other: Posterior left scalp swelling. IMPRESSION: 1. Hemorrhagic contusions in the right frontal greater than right temporal lobes. Small amount of associated subdural and likely subarachnoid hemorrhage. 2. Nondisplaced left temporal bone fracture. Diastasis of the left lambdoid suture with small minimally depressed bone fragment. 3. Associated small left temporal epidural hematoma. 4. Thin subdural hematoma over the left cerebral convexity. Critical Value/emergent results were called by telephone at the time of interpretation on 07/20/2016 at 2:47 pm to Dr. Varney Biles , who verbally acknowledged these results. Electronically Signed   By: Logan Bores M.D.   On: 07/20/2016 14:50    Procedures Procedures (including critical care time)  CRITICAL CARE Performed by: Varney Biles   Total critical care time: 40 minutes  Critical  care time was exclusive of separately billable procedures and treating other patients.  Critical care was necessary to treat or prevent imminent or life-threatening deterioration.  Critical care was time spent personally by me on the following activities: development of treatment plan with patient and/or surrogate as well as nursing, discussions with consultants, evaluation of patient's response to treatment, examination of patient, obtaining history from patient or surrogate, ordering and performing treatments and interventions, ordering and review of laboratory studies, ordering and review of radiographic studies, pulse oximetry and re-evaluation of patient's condition.   Medications Ordered in ED Medications  0.9 %  sodium chloride infusion (not administered)   acetaminophen (TYLENOL) tablet 650 mg (not administered)    Or  acetaminophen (TYLENOL) suppository 650 mg (not administered)  HYDROcodone-acetaminophen (NORCO/VICODIN) 5-325 MG per tablet 1-2 tablet (not administered)  docusate sodium (COLACE) capsule 100 mg (not administered)  senna-docusate (Senokot-S) tablet 1 tablet (not administered)  bisacodyl (DULCOLAX) EC tablet 5 mg (not administered)  magnesium citrate solution 1 Bottle (not administered)  ondansetron (ZOFRAN) tablet 4 mg (not administered)    Or  ondansetron (ZOFRAN) injection 4 mg (not administered)  sodium chloride 0.9 % bolus 1,000 mL (0 mLs Intravenous Stopped 07/20/16 1542)     Initial Impression / Assessment and Plan / ED Course  I have reviewed the triage vital signs and the nursing notes.  Pertinent labs & imaging results that were available during my care of the patient were reviewed by me and considered in my medical decision making (see chart for details).     Pt comes in with cc of weakness. No focal neuro deficits, but pt is noted to be somnolent. Outside of trauma, no explanation for encephalopathy like presentation, and so we got a CT head, and it shows brain bleed. Neurosurgery consulted. Nothing to do at this time according to them. PT needs to be admitted to the ICU.  Final Clinical Impressions(s) / ED Diagnoses   Final diagnoses:  Intracranial bleed ([place])  Closed fracture of skull, unspecified bone, initial encounter [facility])    New Prescriptions New Prescriptions   No medications on file     Varney Biles, MD 07/20/16 1731

## 2016-07-20 NOTE — ED Triage Notes (Signed)
Pt c/o cough onset Tuesday, syncopal episode yesterday, husband reports patient was dazed and nonverbal x 3-5 minutes, followed by abdominal pain and bile emesis. Today c/o posterior headache and fatigue. Dehydrated, not drinking fluids or eating. Pt stacking her breaths, which is new since fall. No anticoagulants. No focal neurologic signs.

## 2016-07-20 NOTE — ED Notes (Signed)
Pt is resting and does not want to get up to give urine sample at the time.

## 2016-07-20 NOTE — Progress Notes (Signed)
eLink Physician-Brief Progress Note Patient Name: Liyat Faulkenberry DOB: 07/01/1965 MRN: [id number]   Date of Service  07/20/2016  HPI/Events of Note  Admitted with [place] after fall Stable on cam and chart check  eICU Interventions  No eICU interventions Management per neurosurgery     Intervention Category Evaluation Type: New Patient Evaluation  Celines Femia 07/20/2016, 6:10 PM

## 2016-07-21 LAB — URINALYSIS, ROUTINE W REFLEX MICROSCOPIC
BILIRUBIN URINE: NEGATIVE
GLUCOSE, UA: NEGATIVE mg/dL
HGB URINE DIPSTICK: NEGATIVE
KETONES UR: 5 mg/dL — AB
Leukocytes, UA: NEGATIVE
Nitrite: NEGATIVE
PROTEIN: NEGATIVE mg/dL
Specific Gravity, Urine: 1.023 (ref 1.005–1.030)
pH: 5 (ref 5.0–8.0)

## 2016-07-21 LAB — HIV ANTIBODY (ROUTINE TESTING W REFLEX): HIV Screen 4th Generation wRfx: NONREACTIVE

## 2016-07-21 MED ORDER — SODIUM CHLORIDE 0.9 % IV BOLUS (SEPSIS)
1000.0000 mL | Freq: Once | INTRAVENOUS | Status: AC
  Administered 2016-07-21: 1000 mL via INTRAVENOUS

## 2016-07-21 NOTE — Progress Notes (Signed)
Pt had not voided since being admitted to hospital - no urine occurrence/mL's charted and pt denying the urge to void. Bladder scanned pt to assess for retention - 834mL resulted from scan. Upon attempt to in/out catheterize - pt woke up fully and offered to try and void on her own. Able to void 452mL on her own. Will continue to monitor for urine retention/urge to void.  Guadalupe Maple, RN

## 2016-07-21 NOTE — Progress Notes (Signed)
Pt sustained hr 40-45bpm this morning. [place], [place] and discussed, will come evaluate pt. Pt a&o x 4, no neuro changes, NAD, will continue to closely monitor.

## 2016-07-21 NOTE — Progress Notes (Signed)
Pt has not voided during shift. This RN encouraged pt to ambulate to bathroom, assisted to standing, then pt stated "I don't need to go to the bathroom right now. I will go when I need to go." This RN asked if pt could attempt to use restroom after NS bolus completes, pt agreed. No other questions/concerns at this time. Husband at bedside.

## 2016-07-21 NOTE — Care Management Note (Signed)
Case Management Note  Patient Details  Name: Nancy Walker MRN: [id number] Date of Birth: 10-11-1965  Subjective/Objective:   51 y.o. female with multiple lobe contusions, small amount of SDH, nondisplaced left temporal bone fracture s/p fall.  PTA, pt independent, lives with spouse.                   Action/Plan: Will follow for discharge planning as pt progresses.  Recommend PT/OT consults when pt able to tolerate therapies.    Expected Discharge Date:                  Expected Discharge Plan:  [place]  In-House Referral:     Discharge planning Services  CM Consult  Post Acute Care Choice:    Choice offered to:     DME Arranged:    DME Agency:     HH Arranged:    [place] Agency:     Status of Service:  In process, will continue to follow  If discussed at Long Length of Stay Meetings, dates discussed:    Additional Comments:  Reinaldo Raddle, RN, BSN  Trauma/Neuro ICU Case Manager [phone]

## 2016-07-22 DIAGNOSIS — R001 Bradycardia, unspecified: Secondary | ICD-10-CM

## 2016-07-22 DIAGNOSIS — S06310S Contusion and laceration of right cerebrum without loss of consciousness, sequela: Secondary | ICD-10-CM

## 2016-07-22 LAB — GLUCOSE, CAPILLARY: GLUCOSE-CAPILLARY: 88 mg/dL (ref 65–99)

## 2016-07-22 MED ORDER — LEVETIRACETAM 500 MG PO TABS
500.0000 mg | ORAL_TABLET | Freq: Two times a day (BID) | ORAL | Status: DC
  Administered 2016-07-22 – 2016-07-26 (×7): 500 mg via ORAL
  Filled 2016-07-22 (×8): qty 1

## 2016-07-22 MED ORDER — WHITE PETROLATUM GEL
Status: AC
  Administered 2016-07-22: 18:00:00
  Filled 2016-07-22: qty 1

## 2016-07-22 NOTE — Progress Notes (Signed)
No issues overnight with the exception of asymptomatic bradycardia.  EXAM:  BP (!) 101/54   Pulse (!) 38   Temp 98.1 F (36.7 C) (Oral)   Resp 12   Ht 5\' 7"  (1.702 m)   Wt 61.1 kg (134 lb 12.8 oz)   SpO2 97%   BMI 21.11 kg/m   Drowsy but easily  Speech fluent, appropriate  CN grossly intact  5/5 BUE/BLE   IMPRESSION:  51 y.o. female s/p fall with right frontal contusion and post-concussive syndrome. Bradycardia  PLAN: - Will get 12-lead - Cont to monitor in ICU

## 2016-07-22 NOTE — Consult Note (Signed)
CARDIOLOGY CONSULT NOTE  Patient ID: Nancy Walker MRN: [id number] DOB/AGE: May 04, 1965 50 y.o.  Admit date: 07/20/2016 Primary Cardiologist: New Reason for Consultation: Bradycardia  HPI: We were consulted by Dr. Kathyrn Sheriff for evaluation of bradycardia.   Patient has minimal past medical history.  No cardiac problems.  No prior history of lightheadedness or syncope.  No family history of arrhythmias, cardiac disease, etc.    She was in her usual state of health until earlier this week when she developed a cough and congestion.  She thought she had a cold.  She says that she would have fairly long paroxysms of coughing.  On Wednesday morning, her husband heard her fall.  When he found her, she was awake.  She does not remember anything about the fall.  After that, she felt weak and was drowsy.  She had a headache.  No nausea/vomiting.  No seizures.  Given ongoing drowsiness/HA, husband brought patient to the ER on Thursday where CT head showed small subdural hematoma and right frontal contusions.  At admission, HR in 50s, NSR.  Since admission, her HR has decreased.  Now at rest, sinus brady in the upper 30s-40s.  HR increased into the 50s when I ambulated her.  She denies lightheadedness. SBP in 100s, no hypotension.   Review of systems complete and found to be negative unless listed above in HPI  Past Medical History: 1. Depression  Family History  Problem Relation Age of Onset  . Cancer Mother     Social History   Social History  . Marital status: Married    Spouse name: N/A  . Number of children: N/A  . Years of education: N/A   Occupational History  . teacher    Social History Main Topics  . Smoking status: Never Smoker  . Smokeless tobacco: Never Used  . Alcohol use No  . Drug use: No  . Sexual activity: Yes    Partners: Male   Other Topics Concern  . Not on file   Social History Narrative  . No narrative on file     Prescriptions Prior to Admission    Medication Sig Dispense Refill Last Dose  . sertraline (ZOLOFT) 100 MG tablet Take 200 mg by mouth daily.  2 07/20/2016 at Unknown time    Physical exam Blood pressure (!) 117/54, pulse (!) 37, temperature 98.1 F (36.7 C), temperature source Oral, resp. rate 19, height 5\' 7"  (1.702 m), weight 134 lb 12.8 oz (61.1 kg), SpO2 96 %. General: NAD Neck: No JVD, no thyromegaly or thyroid nodule.  Lungs: Clear to auscultation bilaterally with normal respiratory effort. CV: Nondisplaced PMI.  Heart regular S1/S2, no S3/S4, no murmur.  No peripheral edema.  No carotid bruit.  Normal pedal pulses.  Abdomen: Soft, nontender, no hepatosplenomegaly, no distention.  Skin: Intact without lesions or rashes.  Neurologic: Alert and oriented x 3.  Psych: Normal affect. Extremities: No clubbing or cyanosis.  HEENT: Normal.   Labs:   Lab Results  Component Value Date   WBC 6.4 07/20/2016   HGB 12.5 07/20/2016   HCT 37.9 07/20/2016   MCV 89.2 07/20/2016   PLT 157 07/20/2016    Recent Labs Lab 07/20/16 1556  NA 139  K 4.1  CL 106  CO2 25  BUN 20  CREATININE 0.60  CALCIUM 8.6*  GLUCOSE 111*   Radiology: - CXR: Clear, no PNA. - CT head: Small SDH, multiple contusions  EKG (personally reviewed): Sinus brady at 36 bpm  ASSESSMENT AND PLAN: 51 yo with minimal past history was admitted after fall with small SDH and frontal contusions.  She was noted to have profound sinus bradycardia.  1. Fall: With SDH and frontal contusions.  Cause of fall is uncertain.  It was not witnessed, she does not remember anything.  She was awake when her husband found her.  Possible syncope.  2. Bradycardia: Sinus bradycardia, HR has dropped since admission => was in 50s then, now in upper 30s-40s.  No hypotension.  She denies lightheadedness.  Possible vagal response to head injury.  Her HR does increase into 50s with slow ambulation.  I am concerned that she could have had a syncopal event.  This certainly could  have been cough syncope given paroxysms of coughing that she was having prior to admission, but she has no prior episode of cough syncope/presyncope.  Cannot rule out sinus node dysfunction.  - Continue to follow on telemetry, no intervention for now.  If brady is due to vagal tone with head injury, should improve over time.  Reassuring that HR rises with exertion.  - Given ?syncope, would consider some form of cardiac monitoring when she goes home (if HR recovers).  - Will get an echocardiogram.   Signed: Loralie Champagne 07/22/2016, 2:45 PM

## 2016-07-22 NOTE — Progress Notes (Signed)
Pt has not voided since 2200 last pm. Encouraged patient to use bedside commode. Assisted pt to side of been then she stated " I don't have to use the bathroom right now." Encourage pt to at least try, she continues to refuse.  Offered a warm bath, coffee, and oob to chair pt also refused. Will continue to encourage patient to increase activity throughout the shift.

## 2016-07-22 NOTE — Progress Notes (Signed)
Called cardiology due to bradycardia sometimes in low 30's. Pt is asymptomatic. Cards believe this is a vagal response to SDH.  Rec continue to monitor. Call them should SBP <90 for assistance.  Appreciative of their help

## 2016-07-22 NOTE — Progress Notes (Signed)
Pt seen and examined. No issues overnight with exception of continued bradycardia, sometimes into 30s  EXAM: Temp:  [98 F (36.7 C)-99.2 F (37.3 C)] 98.1 F (36.7 C) (04/28 0800) Pulse Rate:  [36-88] 38 (04/28 0900) Resp:  [11-20] 12 (04/28 0900) BP: (90-119)/(48-80) 101/54 (04/28 0900) SpO2:  [94 %-98 %] 97 % (04/28 0900) Intake/Output      04/27 0701 - 04/28 0700 04/28 0701 - 04/29 0700   P.O.     I.V. (mL/kg) 1379.7 (22.6) 150 (2.5)   IV Piggyback 1210 105   Total Intake(mL/kg) 2589.7 (42.4) 255 (4.2)   Urine (mL/kg/hr) 1050 (0.7)    Total Output 1050     Net +1539.7 +255        Urine Occurrence 1 x     Somnolent but easily arousable CN grossly intact MAE good strength  LABS: Lab Results  Component Value Date   CREATININE 0.60 07/20/2016   BUN 20 07/20/2016   NA 139 07/20/2016   K 4.1 07/20/2016   CL 106 07/20/2016   CO2 25 07/20/2016   Lab Results  Component Value Date   WBC 6.4 07/20/2016   HGB 12.5 07/20/2016   HCT 37.9 07/20/2016   MCV 89.2 07/20/2016   PLT 157 07/20/2016    EKG yesterday reviewed, demonstrates sinus bradycardia  IMPRESSION: - 51 y.o. female s/p fall with right frontal contusion and concussion with likely explains her lethargy. I am concerned that her fall was actually a result of an underlying dysrhythmia given her significant bradycardia.  PLAN: - Will consult cardiology - Cont supportive care and monitor neurologic status

## 2016-07-23 ENCOUNTER — Encounter (HOSPITAL_COMMUNITY): Payer: Self-pay | Admitting: Internal Medicine

## 2016-07-23 ENCOUNTER — Inpatient Hospital Stay (HOSPITAL_COMMUNITY): Payer: BLUE CROSS/BLUE SHIELD

## 2016-07-23 DIAGNOSIS — S06310A Contusion and laceration of right cerebrum without loss of consciousness, initial encounter: Secondary | ICD-10-CM

## 2016-07-23 DIAGNOSIS — R9431 Abnormal electrocardiogram [ECG] [EKG]: Secondary | ICD-10-CM

## 2016-07-23 LAB — ECHOCARDIOGRAM COMPLETE
CHL CUP DOP CALC LVOT VTI: 26.4 cm
CHL CUP MV DEC (S): 259
E decel time: 259 msec
E/e' ratio: 8.77
FS: 27 % — AB (ref 28–44)
Height: 67 in
IV/PV OW: 0.72
LA ID, A-P, ES: 30 mm
LA diam index: 1.77 cm/m2
LA vol A4C: 58.3 ml
LA vol index: 27 mL/m2
LA vol: 45.7 mL
LEFT ATRIUM END SYS DIAM: 30 mm
LV E/e' medial: 8.77
LV TDI E'LATERAL: 11
LV e' LATERAL: 11 cm/s
LVEEAVG: 8.77
LVOT SV: 83 mL
LVOT area: 3.14 cm2
LVOTD: 20 mm
LVOTPV: 109 cm/s
Lateral S' vel: 17.6 cm/s
MV Peak grad: 4 mmHg
MV pk A vel: 82.5 m/s
MVPKEVEL: 96.5 m/s
PW: 7.5 mm — AB (ref 0.6–1.1)
RV sys press: 43 mmHg
Reg peak vel: 316 cm/s
TAPSE: 27.7 mm
TDI e' medial: 10.1
TR max vel: 316 cm/s
Weight: 2156.8 oz

## 2016-07-23 LAB — T4, FREE: FREE T4: 1.17 ng/dL — AB (ref 0.61–1.12)

## 2016-07-23 LAB — TSH: TSH: 1.015 u[IU]/mL (ref 0.350–4.500)

## 2016-07-23 MED ORDER — SERTRALINE HCL 100 MG PO TABS
200.0000 mg | ORAL_TABLET | Freq: Every day | ORAL | Status: DC
  Administered 2016-07-23 – 2016-07-25 (×3): 200 mg via ORAL
  Filled 2016-07-23 (×4): qty 2

## 2016-07-23 NOTE — Progress Notes (Signed)
  Echocardiogram 2D Echocardiogram has been performed.  Johny Chess 07/23/2016, 3:43 PM

## 2016-07-23 NOTE — Progress Notes (Signed)
Pt seen and examined. No issues overnight.  EXAM: Temp:  [97.8 F (36.6 C)-98.1 F (36.7 C)] 97.9 F (36.6 C) (04/29 0400) Pulse Rate:  [33-45] 40 (04/29 0600) Resp:  [11-21] 13 (04/29 0600) BP: (96-131)/(47-72) 106/47 (04/29 0600) SpO2:  [93 %-98 %] 98 % (04/29 0600) Intake/Output      04/28 0701 - 04/29 0700   I.V. (mL/kg) 1725 (28.2)   IV Piggyback 105   Total Intake(mL/kg) 1830 (30)   Urine (mL/kg/hr) 700 (0.5)   Total Output 700   Net +1130       Urine Occurrence 1000 x    Awake and alert Follows commands throughout Full strength  Stable Transfer to stepdown Continue telemetry

## 2016-07-23 NOTE — Consult Note (Signed)
ELECTROPHYSIOLOGY CONSULT NOTE    Primary Care Physician: Aretta Nip, MD Referring Physician:  Dr Aundra Dubin  Admit Date: 07/20/2016  Reason for consultation: sinus bradycardia  Nancy Walker is a 51 y.o. female with admission for fall/ SDH who is noted to have sinus bradycardia.  EP is consulted for further evaluation and management by Dr Aundra Dubin. The patient is currently sleeping.  She wakes but is unable to stay awake for history.  History is therefore provided by her spouse. He states that she has been healthy and active, exercising without limitation.  This past Tuesday, she had cough, congestion, and did not "feel well".  She stayed home from work on Wednesday because of this.  While working from home, the patient's spouse heard a "thump" and found her unconscious after falling down stairs.  It is unclear as to whether she had syncope and fell or not.  She had just complained to him that she was not feeling well. After the fall, she became progressive somnolent and had a headache.  She was subsequent brought to [facility] hospital Thursday where she was found to have heart rates 50s (EKG reviewed).  She was  Also found to have a small subdural hematoma with R frontal contusions.  She has since been observed to have sinus bradycardia with HRs 30s.  With getting up or ambulation, her heart rate goes to 50s.  Appears to be asymptomatic presently.  Past Medical History:  Diagnosis Date  . Anxiety   . Depression    Past Surgical History:  Procedure Laterality Date  . CESAREAN SECTION      . docusate sodium  100 mg Oral BID  . levETIRAcetam  500 mg Oral BID   . sodium chloride 75 mL/hr at 07/22/16 0500    Allergies  Allergen Reactions  . Latex   . Penicillins   . Steri-Strip Compound Benzoin [Benzoin Compound]     Social History   Social History  . Marital status: Married    Spouse name: N/A  . Number of children: N/A  . Years of education: N/A   Occupational History  . teacher     Social History Main Topics  . Smoking status: Never Smoker  . Smokeless tobacco: Never Used  . Alcohol use No  . Drug use: No  . Sexual activity: Yes    Partners: Male   Other Topics Concern  . Not on file   Social History Narrative   Lives in [place].  Works as a Print production planner.   2 children,  married    Family History  Problem Relation Age of Onset  . Cancer Mother   pts spouse reports that her mother had bradycardia also.  He is not aware of any FH of sudden death or arrhythmias  ROS- pt quickly returns to sleep and is unable to provide  Physical Exam: Telemetry:  Sinus bradsycardia 30s, no prolonged pauses, no av block Vitals:   07/23/16 0800 07/23/16 0812 07/23/16 0900 07/23/16 1000  BP: (!) 98/59  (!) 115/54 119/61  Pulse: (!) 37  (!) 41   Resp: 18  13 14   Temp:  97.9 F (36.6 C)    TempSrc:  Oral    SpO2: 94%  97%   Weight:      Height:        GEN- The patient is ill appearing, sleeping but rouses but quickly returns to sleep,  Unable to provide history  Head- normocephalic, atraumatic Eyes-  Sclera clear, conjunctiva pink  Ears- hearing intact Oropharynx- clear Neck- supple, no JVP Lymph- no cervical lymphadenopathy Lungs- Clear to ausculation bilaterally, normal work of breathing Heart- bradycardic regular rhythm GI- soft, NT, ND, + BS Extremities- no clubbing, cyanosis, or edema MS- no significant deformity or atrophy Skin- no rash or lesion Psych- euthymic mood, full affect Neuro- strength and sensation are intact  All ekg tracings in epic are reviewed personally and reveal sinus bradycardia.  unfortunately there are no old ekgs to compare  Labs:   Lab Results  Component Value Date   WBC 6.4 07/20/2016   HGB 12.5 07/20/2016   HCT 37.9 07/20/2016   MCV 89.2 07/20/2016   PLT 157 07/20/2016    Recent Labs Lab 07/20/16 1556  NA 139  K 4.1  CL 106  CO2 25  BUN 20  CREATININE 0.60  CALCIUM 8.6*  GLUCOSE 111*     Radiology:  head CT is reviewed  Echo: pending  ASSESSMENT AND PLAN:   1.  Sinus bradycardia Unclear etiology I suspect that sinus bradycardia is reactive and secondary to her acute medical illness.  I am not convinced that bradycardia caused her fall.  No urgent indication for intervention.  Will continue to monitor on telemetry as she clinically improves from her hemorrhagic contusions and SDH.  Check TFTs.  Echo pending I had a long discussion with her spouse.  He agrees with a conservative approach and would like to avoid EP procedures also.    I will follow her closely while here   Thompson Grayer, MD 07/23/2016  11:44 AM

## 2016-07-24 LAB — GLUCOSE, CAPILLARY: Glucose-Capillary: 115 mg/dL — ABNORMAL HIGH (ref 65–99)

## 2016-07-24 NOTE — Progress Notes (Signed)
   SUBJECTIVE: Sleeping but rouses, not very interactive.  Doesn't answer my questions.  Her overall condition is quite blunted.  . docusate sodium  100 mg Oral BID  . levETIRAcetam  500 mg Oral BID  . sertraline  200 mg Oral Daily   . sodium chloride 75 mL/hr at 07/24/16 0700    OBJECTIVE: Physical Exam: Vitals:   07/23/16 2354 07/24/16 0000 07/24/16 0400 07/24/16 0421  BP: 115/67   113/71  Pulse: (!) 37 (!) 37 (!) 41 (!) 39  Resp: 12 18 12 15   Temp: 98.4 F (36.9 C)   97.6 F (36.4 C)  TempSrc: Oral   Oral  SpO2: 94% 93% 95% 98%  Weight:      Height:        Intake/Output Summary (Last 24 hours) at 07/24/16 0804 Last data filed at 07/23/16 2241  Gross per 24 hour  Intake              120 ml  Output             1900 ml  Net            -1780 ml    Telemetry reveals sinus bradycardia 30s-40s,  Rare nonsustained atach  GEN- The patient is ill appearing, sleeping but rouses, doesn't answer my questions Head- normocephalic, atraumatic Eyes-  Sclera clear, conjunctiva pink Ears- hearing intact Oropharynx- clear Neck- supple,   Lungs- Clear to ausculation bilaterally, normal work of breathing Heart- bradycardic regular rhythm GI- soft, NT, ND, + BS Extremities- no clubbing, cyanosis, or edema Skin- no rash or lesion Psych- blunted affect Neuro- moves extremities purposefully  Thyroid Function Tests:  Recent Labs  07/23/16 1548  TSH 1.015   Echo is reviewed  ASSESSMENT AND PLAN:  1.  Sinus bradycardia Unclear etiology. I continue to believe that her sinus bradycardia is reactive and secondary to her acute medical illness.  I remain optimistic that her sinus bradycardia will resolve as she clinically improves from her hemorrhagic contusions and SDH.  TFTs and echo were unrevealing.  No new recs As she improves, would encourage ambulation/ up to chair.  Keep on telemetry while here   I will follow her closely while here Thompson Grayer, MD 07/24/2016 8:04  AM

## 2016-07-24 NOTE — Care Management Note (Signed)
Case Management Note Original Note Created by Ellan Lambert Patient Details  Name: Nancy Walker MRN: [id number] Date of Birth: 05-19-1965  Subjective/Objective:   51 y.o. female with multiple lobe contusions, small amount of SDH, nondisplaced left temporal bone fracture s/p fall.  PTA, pt independent, lives with spouse.                   Action/Plan: Will follow for discharge planning as pt progresses.  Recommend PT/OT consults when pt able to tolerate therapies.    Expected Discharge Date:                  Expected Discharge Plan:  [place]  In-House Referral:     Discharge planning Services  CM Consult  Post Acute Care Choice:    Choice offered to:     DME Arranged:    DME Agency:     HH Arranged:    [place] Agency:     Status of Service:  In process, will continue to follow  If discussed at Long Length of Stay Meetings, dates discussed:    Additional Comments:  07/24/2016 Pt now on SD.  Remains bradycardia and not at baseline mentally.  Will benefit from PT/OT eval - requested from attending Elenor Quinones, RN, BSN [phone]

## 2016-07-24 NOTE — Progress Notes (Signed)
No issues overnight. Pt appears more awake today. No complaints.  EXAM:  BP (!) 117/94 (BP Location: Right Arm)   Pulse (!) 39   Temp 99 F (37.2 C) (Oral)   Resp 14   Ht 5\' 7"  (1.702 m)   Wt 61.1 kg (134 lb 12.8 oz)   SpO2 97%   BMI 21.11 kg/m   Awake, alert Answers questions y/n CN grossly intact  5/5 BUE/BLE   IMPRESSION:  51 y.o. female s/p fall with right frontal contusion and post-concussive syndrome Bradycardia felt to be reactive to head injury  PLAN: - Will get repeat CTH today - Will get PT/OT, may need rehab

## 2016-07-24 NOTE — Progress Notes (Signed)
Alert but not fully  interactive, prefers to take pills one at a time with space of few  Min, claiming that it might give her stomach upset.

## 2016-07-25 ENCOUNTER — Inpatient Hospital Stay (HOSPITAL_COMMUNITY): Payer: BLUE CROSS/BLUE SHIELD

## 2016-07-25 LAB — GLUCOSE, CAPILLARY: Glucose-Capillary: 96 mg/dL (ref 65–99)

## 2016-07-25 MED ORDER — ENSURE ENLIVE PO LIQD
237.0000 mL | Freq: Three times a day (TID) | ORAL | Status: DC
  Administered 2016-07-25 – 2016-07-26 (×3): 237 mL via ORAL

## 2016-07-25 NOTE — Evaluation (Signed)
Physical Therapy Evaluation Patient Details Name: Nancy Walker MRN: [id number] DOB: Apr 23, 1965 Today's Date: 07/25/2016   History of Present Illness  Pt is a 51 y.o. female admitted to ED on 07/20/16 after falling down stairs and hitting her head; with bradycardia likely secondary to head injury. CT 4/26 shows R frontal and R temporal hemorrhagic contusions with associated SDH and likely SAH; nondisplaced L temporal bone fx; thin SDH over L cerebral convexity. CT 5/1 shows significant surrounding edema with midline shift. Pertinent PMH includes depression and anxiety.  Clinical Impression  Pt presents to PT with fatigue, headache, generalized weakness, and an overall decrease in functional mobility secondary to above. PTA, pt indep with all mobility and lives at home with family; husband able to provide 24/7 assist at d/c if needed. Today, pt able to amb with min guard for balance; indep with pericare in bathroom. Educ on concussion symptoms and fall risk prevention. Pt reoriented to reason for hospitalization. Pt would benefit from continued acute PT services to maximize functional mobility and independence prior to d/c home.    Follow Up Recommendations No PT follow up;Supervision for mobility/OOB    Equipment Recommendations  None recommended by PT    Recommendations for Other Services OT consult     Precautions / Restrictions Precautions Precautions: Fall Precaution Comments: Watch HR Restrictions Weight Bearing Restrictions: No      Mobility  Bed Mobility Overal bed mobility: Needs Assistance Bed Mobility: Supine to Sit;Sit to Supine     Supine to sit: HOB elevated;Supervision Sit to supine: HOB elevated;Supervision   General bed mobility comments: Supervision for safety, no physical assist required.  Transfers Overall transfer level: Needs assistance Equipment used: None Transfers: Sit to/from Stand Sit to Stand: Min guard         General transfer comment: Stood x2  from bed and toilet with min guard for safety; no physical assist required. Indep with pericare in bathroom.  Ambulation/Gait Ambulation/Gait assistance: Min guard Ambulation Distance (Feet): 50 Feet Assistive device: None Gait Pattern/deviations: Decreased stride length;Step-through pattern Gait velocity: Decreased Gait velocity interpretation: Below normal speed for age/gender General Gait Details: Amb to/from bathroom and into hallway with min guard for balance; pt declining further mobility secondary to fatigue and headache.  Stairs            Wheelchair Mobility    Modified Rankin (Stroke Patients Only) Modified Rankin (Stroke Patients Only) Pre-Morbid Rankin Score: No symptoms Modified Rankin: Moderately severe disability     Balance Overall balance assessment: Needs assistance Sitting-balance support: No upper extremity supported;Feet supported;Feet unsupported Sitting balance-Leahy Scale: Good Sitting balance - Comments: Able to don socks at EOB indep   Standing balance support: No upper extremity supported;During functional activity Standing balance-Leahy Scale: Fair Standing balance comment: Min guard for safety in standing.                             Pertinent Vitals/Pain Pain Assessment: 0-10 Pain Score: 5  Pain Location: Head Pain Descriptors / Indicators: Headache;Dull Pain Intervention(s): Limited activity within patient's tolerance;Monitored during session;Premedicated before session    Home Living Family/patient expects to be discharged to:: Private residence Living Arrangements: Spouse/significant other Available Help at Discharge: Family Type of Home: House Home Access: Stairs to enter Entrance Stairs-Rails: None Entrance Stairs-Number of Steps: 2 Home Layout: Two level;Bed/bath upstairs Home Equipment: None      Prior Function Level of Independence: Independent  Comments: Works as Print production planner; 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     Hand Dominance        Extremity/Trunk Assessment   Upper Extremity Assessment Upper Extremity Assessment: Generalized weakness    Lower Extremity Assessment Lower Extremity Assessment: Generalized weakness    Cervical / Trunk Assessment Cervical / Trunk Assessment: Normal  Communication   Communication: No difficulties  Cognition Arousal/Alertness: Awake/alert Behavior During Therapy: WFL for tasks assessed/performed Overall Cognitive Status: Impaired/Different from baseline Area of Impairment: Orientation                 Orientation Level: Disoriented to;Situation;Time             General Comments: Unable to recall fall, stating she is in hospital because her head hurts; reoriented to details of situation; also stated it was 07/24/16, surprised to hear it was 5/1 and that means she has missed two days of work. Pt appears tired throughout session, becoming more alert with mobility.      General Comments General comments (skin integrity, edema, etc.): HR 43 at rest, up to 73 with mobility.    Exercises     Assessment/Plan    PT Assessment Patient needs continued PT services  PT Problem List Decreased strength;Decreased activity tolerance;Cardiopulmonary status limiting activity;Decreased mobility;Decreased balance;Pain       PT Treatment Interventions DME instruction;Gait training;Stair training;Patient/family education;Functional mobility training;Therapeutic activities;Therapeutic exercise;Balance training    PT Goals (Current goals can be found in the Care Plan section)  Acute Rehab PT Goals Patient Stated Goal: Return to work PT Goal Formulation: With patient Time For Goal Achievement: 08/08/16 Potential to Achieve Goals: Good    Frequency Min 4X/week   Barriers to discharge        Co-evaluation               AM-PAC PT "6 Clicks" Daily Activity  Outcome Measure Difficulty turning over in bed (including adjusting  bedclothes, sheets and blankets)?: None Difficulty moving from lying on back to sitting on the side of the bed? : None Difficulty sitting down on and standing up from a chair with arms (e.g., wheelchair, bedside commode, etc,.)?: None Help needed moving to and from a bed to chair (including a wheelchair)?: None Help needed walking in hospital room?: A Little Help needed climbing 3-5 steps with a railing? : A Little 6 Click Score: 22    End of Session Equipment Utilized During Treatment: Gait belt Activity Tolerance: Patient tolerated treatment well Patient left: in bed;with family/visitor present;with call bell/phone within reach Nurse Communication: Mobility status PT Visit Diagnosis: Unsteadiness on feet (R26.81)    Time: 7616-0737 PT Time Calculation (min) (ACUTE ONLY): 26 min   Charges:   PT Evaluation $PT Eval Moderate Complexity: 1 Procedure PT Treatments $Therapeutic Activity: 8-22 mins   PT G Codes:       Enis Gash, SPT Office-[phone]  Mabeline Caras 07/25/2016, 1:01 PM

## 2016-07-25 NOTE — Progress Notes (Addendum)
No issues overnight. Pt appears more awake today. No complaints.  EXAM:  BP (!) 104/59 (BP Location: Left Arm)   Pulse 68   Temp 99 F (37.2 C) (Oral)   Resp (!) 28   Ht 5\' 7"  (1.702 m)   Wt 61.1 kg (134 lb 12.8 oz)   SpO2 100%   BMI 21.11 kg/m   Awake, alert,  Responds appropriately CN grossly intact  5/5 BUE/BLE   IMAGING: CT head reviewed which demonstrates expected evolution of right orbito-frontal contusion with surrounding edema. Minimal MLS and minimal effacement of right frontal horn.  IMPRESSION:  51 y.o. female s/p fall with right frontal contusion, bradycardia likely secondary to head injury.   PLAN: - Therapy eval today

## 2016-07-25 NOTE — Progress Notes (Signed)
Heart rates are stable Proceed with PT/OT and routine neurosurgical management. I will continue to follow heart rates conservatively for now  Thompson Grayer MD, [facility] 07/25/2016 7:28 AM

## 2016-07-25 NOTE — Evaluation (Signed)
Occupational Therapy Evaluation Patient Details Name: Nancy Walker MRN: [id number] DOB: 1965/11/08 Today's Date: 07/25/2016    History of Present Illness Pt is a 52 y.o. female admitted to ED on 07/20/16 after falling down stairs and hitting her head; with bradycardia likely secondary to head injury. CT 4/26 shows R frontal and R temporal hemorrhagic contusions with associated SDH and likely SAH; nondisplaced L temporal bone fx; thin SDH over L cerebral convexity. CT 5/1 shows significant surrounding edema with midline shift. Pertinent PMH includes depression and anxiety.   Clinical Impression   Husband reports that pt stated, "I don't feel good," just before she collapsed on the stairs. Presents with lethargy, headache, impaired cognition and unsteady gait. Educated pt and husband in symptoms of brain injury they may experience and altering the environment as well as the importance of rest. Husband will be able to supervise pt closely. Will follow acutely. Do not anticipate need for post acute OT, but will update as needed.    Follow Up Recommendations  No OT follow up    Equipment Recommendations  None recommended by OT    Recommendations for Other Services       Precautions / Restrictions Precautions Precautions: Fall Precaution Comments: Watch HR Restrictions Weight Bearing Restrictions: No      Mobility Bed Mobility Overal bed mobility: Needs Assistance Bed Mobility: Supine to Sit;Sit to Supine     Supine to sit: HOB elevated;Supervision Sit to supine: HOB elevated;Supervision   General bed mobility comments: Supervision for safety, no physical assist required.  Transfers Overall transfer level: Needs assistance Equipment used: None Transfers: Sit to/from Stand Sit to Stand: Min guard         General transfer comment: from bed and toilet    Balance Overall balance assessment: Needs assistance   Sitting balance-Leahy Scale: Good Sitting balance - Comments: Able  to don socks at EOB indep     Standing balance-Leahy Scale: Fair                             ADL either performed or assessed with clinical judgement   ADL Overall ADL's : Needs assistance/impaired Eating/Feeding: Independent;Sitting   Grooming: Wash/dry hands;Standing;Min guard   Upper Body Bathing: Minimal assistance;Sitting   Lower Body Bathing: Min guard;Sit to/from stand   Upper Body Dressing : Minimal assistance;Sitting   Lower Body Dressing: Min guard;Sit to/from stand   Toilet Transfer: Min guard;Ambulation   Toileting- Clothing Manipulation and Hygiene: Min guard;Sit to/from stand       Functional mobility during ADLs: Min guard General ADL Comments: Educated pt and husband in symptoms of brain injury and importance of rest and minimizing over stimulation including screen time. Educated husband in options for seated showering.     Vision Baseline Vision/History: Wears glasses Wears Glasses: Reading only Patient Visual Report: No change from baseline       Perception     Praxis      Pertinent Vitals/Pain Pain Assessment: 0-10 Pain Score: 5  Pain Location: Head Pain Descriptors / Indicators: Headache;Dull Pain Intervention(s): Monitored during session;Repositioned     Hand Dominance Right   Extremity/Trunk Assessment Upper Extremity Assessment Upper Extremity Assessment: Generalized weakness   Lower Extremity Assessment Lower Extremity Assessment: Generalized weakness   Cervical / Trunk Assessment Cervical / Trunk Assessment: Normal   Communication Communication Communication: No difficulties   Cognition Arousal/Alertness: Lethargic Behavior During Therapy: WFL for tasks assessed/performed Overall Cognitive Status: Impaired/Different from  baseline Area of Impairment: Orientation;Problem solving;Attention;Safety/judgement                 Orientation Level: Disoriented to;Situation;Time Current Attention Level: Selective      Safety/Judgement: Decreased awareness of deficits   Problem Solving: Slow processing     General Comments       Exercises     Shoulder Instructions      Home Living Family/patient expects to be discharged to:: Private residence Living Arrangements: Spouse/significant other;Children (68 and 27 year old) Available Help at Discharge: Family;Available 24 hours/day (husband works from home) Type of Home: House Home Access: Stairs to enter Technical brewer of Steps: 2 Entrance Stairs-Rails: None Home Layout: Two level;Bed/bath upstairs Alternate Level Stairs-Number of Steps: 12 Alternate Level Stairs-Rails: Right Bathroom Shower/Tub: Occupational psychologist: Standard (comfort height downstairs)     Home Equipment: None          Prior Functioning/Environment Level of Independence: Independent        Comments: Works as Print production planner; 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        OT Problem List: Decreased strength;Decreased activity tolerance;Impaired balance (sitting and/or standing);Decreased cognition;Decreased knowledge of use of DME or AE;Pain      OT Treatment/Interventions: Self-care/ADL training;Cognitive remediation/compensation;Patient/family education;DME and/or AE instruction    OT Goals(Current goals can be found in the care plan section) Acute Rehab OT Goals Patient Stated Goal: Return to work OT Goal Formulation: With patient Time For Goal Achievement: 08/08/16 Potential to Achieve Goals: Good ADL Goals Pt Will Perform Grooming: with supervision;standing Pt Will Transfer to Toilet: with supervision;ambulating;regular height toilet Pt Will Perform Toileting - Clothing Manipulation and hygiene: with supervision;sit to/from stand Pt Will Perform Tub/Shower Transfer: Shower transfer;with supervision;ambulating;shower seat  OT Frequency: Min 2X/week   Barriers to D/C:            Co-evaluation              AM-PAC PT "6 Clicks" Daily  Activity     Outcome Measure Help from another person eating meals?: None Help from another person taking care of personal grooming?: A Little Help from another person toileting, which includes using toliet, bedpan, or urinal?: A Little Help from another person bathing (including washing, rinsing, drying)?: A Little Help from another person to put on and taking off regular upper body clothing?: A Little Help from another person to put on and taking off regular lower body clothing?: A Little 6 Click Score: 19   End of Session Equipment Utilized During Treatment: Gait belt  Activity Tolerance: Patient limited by fatigue Patient left: in bed;with call bell/phone within reach;with family/visitor present  OT Visit Diagnosis: Unsteadiness on feet (R26.81);Pain;Other symptoms and signs involving cognitive function                Time: 9702-6378 OT Time Calculation (min): 19 min Charges:  OT General Charges $OT Visit: 1 Procedure OT Evaluation $OT Eval Moderate Complexity: 1 Procedure G-Codes:      Malka So 07/25/2016, 3:44 PM  [phone]

## 2016-07-26 LAB — GLUCOSE, CAPILLARY
Glucose-Capillary: 101 mg/dL — ABNORMAL HIGH (ref 65–99)
Glucose-Capillary: 106 mg/dL — ABNORMAL HIGH (ref 65–99)

## 2016-07-26 NOTE — Progress Notes (Signed)
Telemetry is reviewed and reveals stable sinus bradycardia.  Hopefully, her heart rates will recover. I will see her back in 2 weeks for reassessment. She should return to the ED for any presyncope or syncope.  Thompson Grayer MD, [facility] 07/26/2016

## 2016-07-26 NOTE — Evaluation (Signed)
Speech Language Pathology Evaluation Patient Details Name: Nancy Walker MRN: [id number] DOB: 1965/06/19 Today's Date: 07/26/2016 Time: 4128-7867 SLP Time Calculation (min) (ACUTE ONLY): 15 min  Problem List:  Patient Active Problem List   Diagnosis Date Noted  . Bradycardia   . Frontal lobe contusion ([place]) 07/20/2016   Past Medical History:  Past Medical History:  Diagnosis Date  . Anxiety   . Depression    Past Surgical History:  Past Surgical History:  Procedure Laterality Date  . CESAREAN SECTION     HPI:  Pt is a 51 y.o. female admitted to ED on 07/20/16 after falling down stairs and hitting her head; with bradycardia likely secondary to head injury. CT 4/26 shows R frontal and R temporal hemorrhagic contusions with associated SDH and likely SAH; nondisplaced L temporal bone fx; thin SDH over L cerebral convexity. CT 5/1 shows significant surrounding edema with midline shift. Pertinent PMH includes depression and anxiety.   Assessment / Plan / Recommendation Clinical Impression  Pt seen for cognitive-linguistic evaluation soon after OT completed MOCA.  Pt unable to recall most items from test; responses to queries about specific recall led to vague recollections - "I was supposed to remember stuff."  Affect flat; monotone output. Pt provides basic biographical information but with limited spontaneous output.  Unable to describe specifics of her injury.  Impaired intellectual awareness; reduced divergent problem-solving, determining alternative solutions to hypothetical problems.  Pain likely is contributing to performance, but cognition is clearly impaired s/p head injury.  Per chart and pt, her father, pt is D/Cing home today with spouse's supervision.  Will need 24/7 supervision. Recommend OP SLP services for cognitive-linguistic deficits.     SLP Assessment  SLP Recommendation/Assessment: All further Speech Lanaguage Pathology  needs can be addressed in the next venue of care SLP  Visit Diagnosis: Cognitive communication deficit (R41.841)    Follow Up Recommendations  Outpatient SLP    Frequency and Duration           SLP Evaluation Cognition  Overall Cognitive Status: Impaired/Different from baseline Arousal/Alertness: Awake/alert Orientation Level: Oriented to person;Oriented to place;Oriented to time Attention: Sustained Sustained Attention: Impaired Sustained Attention Impairment: Verbal basic Memory: Impaired Memory Impairment: Storage deficit;Retrieval deficit Awareness: Impaired Awareness Impairment: Intellectual impairment Problem Solving: Impaired Behaviors:  (flat affect)       Comprehension  Auditory Comprehension Overall Auditory Comprehension: Appears within functional limits for tasks assessed Reading Comprehension Reading Status: Not tested    Expression Expression Primary Mode of Expression: Verbal Verbal Expression Overall Verbal Expression: Appears within functional limits for tasks assessed Written Expression Dominant Hand: Right   Oral / Motor  Oral Motor/Sensory Function Overall Oral Motor/Sensory Function: Within functional limits Motor Speech Overall Motor Speech: Appears within functional limits for tasks assessed   GO                    Nancy Walker 07/26/2016, 12:34 PM

## 2016-07-26 NOTE — Progress Notes (Signed)
All discharge instructions reviewed in detail with pt, husband, and pt's father; understanding verbalized by all.  VSS.  Pt & family deny any questions or concerns prior to discharge.  Pt discharged to home with family in stable condition.

## 2016-07-26 NOTE — Progress Notes (Signed)
Administered [place] Cognitive Assessment [facility]) with pt scoring 18/30 (26 being WNL). Pt with deficits in executive functioning, memory, language and abstract thinking. Clinically, pt presents with slow processing and decreased awareness of deficits. Father in room and educated in safety and behaviors that may impact ADL and IADL at home. Dr. Kathyrn Sheriff aware of results of assessment and pt's HR and agreeable for pt to have post acute OT. Recommending HHOT initially, with transition to [place] when pt can tolerate.   07/26/16 1129  OT Visit Information  Last OT Received On 07/26/16  History of Present Illness Pt is a 51 y.o. female admitted to ED on 07/20/16 after falling down stairs and hitting her head; with bradycardia likely secondary to head injury. CT 4/26 shows R frontal and R temporal hemorrhagic contusions with associated SDH and likely SAH; nondisplaced L temporal bone fx; thin SDH over L cerebral convexity. CT 5/1 shows significant surrounding edema with midline shift. Pertinent PMH includes depression and anxiety.  Precautions  Precautions Fall  Precaution Comments Low HR  Pain Assessment  Pain Assessment Faces  Faces Pain Scale 4  Pain Location Head  Pain Descriptors / Indicators Headache;Dull  Pain Intervention(s) Monitored during session;Premedicated before session;Repositioned  Cognition  Arousal/Alertness Lethargic  Behavior During Therapy Flat affect  Overall Cognitive Status Impaired/Different from baseline  Area of Impairment Attention;Memory;Following commands;Safety/judgement;Awareness;Problem solving  Current Attention Level Selective  Memory Decreased short-term memory  Following Commands Follows one step commands with increased time;Follows multi-step commands inconsistently  Safety/Judgement Decreased awareness of deficits  Problem Solving Slow processing;Difficulty sequencing;Requires verbal cues  General Comments Administered MOCA with score ot 18/30.  ADL  General  ADL Comments Educated pt's father on pt's cognitive deficits and what behaviors pt may demonstrate at home, level of assistance needed. Father appearing to deny any difficulties, although he observed administration of the exam.  Bed Mobility  Overal bed mobility Needs Assistance  Bed Mobility Supine to Sit;Sit to Supine  Supine to sit HOB elevated;Supervision  Sit to supine HOB elevated;Supervision  Balance  Overall balance assessment Needs assistance  Sitting-balance support No upper extremity supported;Feet supported;Feet unsupported  Sitting balance-Leahy Scale Good  OT - End of Session  Activity Tolerance Patient limited by fatigue  Patient left in bed;with call bell/phone within reach;with family/visitor present  Nurse Communication (score on MOCA)  OT Assessment/Plan  OT Plan Discharge plan needs to be updated  OT Visit Diagnosis Unsteadiness on feet (R26.81);Pain;Other symptoms and signs involving cognitive function  OT Frequency (ACUTE ONLY) Min 2X/week  Follow Up Recommendations Home health OT;Supervision/Assistance - 24 hour (progress to OPOT when able to tolerate)  OT Equipment None recommended by OT  AM-PAC OT "6 Clicks" Daily Activity Outcome Measure  Help from another person eating meals? 4  Help from another person taking care of personal grooming? 3  Help from another person toileting, which includes using toliet, bedpan, or urinal? 3  Help from another person bathing (including washing, rinsing, drying)? 3  Help from another person to put on and taking off regular upper body clothing? 4  Help from another person to put on and taking off regular lower body clothing? 3  6 Click Score 20  ADL G Code Conversion CJ  OT Goal Progression  Progress towards OT goals Progressing toward goals  Acute Rehab OT Goals  Patient Stated Goal Return to work  OT Goal Formulation With patient  Time For Goal Achievement 08/08/16  Potential to Achieve Goals Good  OT Time Calculation  OT Start Time (ACUTE ONLY) 1055  OT Stop Time (ACUTE ONLY) 1122  OT Time Calculation (min) 27 min  OT General Charges  $OT Visit 1 Procedure  OT Treatments  $Self Care/Home Management  8-22 mins  07/26/2016 Nestor Lewandowsky, OTR/L Pager: [phone]

## 2016-07-26 NOTE — Progress Notes (Signed)
Call back received from [place], [place].  Per PA, Dr. Rayann Heman stated he reviewed pt's heart rates this AM & is aware of rates sustaining mid 30's.  PA stated MD ok with pt being discharged home & requesting pt follow up with him May 14th.  PA to place follow up information in pt discharge info.

## 2016-07-26 NOTE — Progress Notes (Signed)
No issues overnight. Pt has no significant complaints today. Seen by PT/OT yesterday, did well. No outpatient therapy needs.  EXAM:  BP 111/71 (BP Location: Left Arm)   Pulse (!) 39   Temp 98.5 F (36.9 C) (Oral)   Resp 11   Ht 5\' 7"  (1.702 m)   Wt 61.1 kg (134 lb 12.8 oz)   SpO2 96%   BMI 21.11 kg/m   Awake, alert, oriented  Speech fluent, appropriate  CN grossly intact  5/5 BUE/BLE   IMPRESSION:  51 y.o. female s/p fall with right frontal contusion, recovering as expected. Neurologically stable. Sinus brady likely secondary to brain injury.  PLAN: - Will d/c home today. - Plan on outpatient f/u with me and cardiology - Dr. Rayann Heman.

## 2016-07-26 NOTE — Progress Notes (Signed)
Physical Therapy Treatment Patient Details Name: Nancy Walker MRN: [id number] DOB: 1965-07-08 Today's Date: 07/26/2016    History of Present Illness Pt is a 51 y.o. female admitted to ED on 07/20/16 after falling down stairs and hitting her head; with bradycardia likely secondary to head injury. CT 4/26 shows R frontal and R temporal hemorrhagic contusions with associated SDH and likely SAH; nondisplaced L temporal bone fx; thin SDH over L cerebral convexity. CT 5/1 shows significant surrounding edema with midline shift. Pertinent PMH includes depression and anxiety.   PT Comments    Pt slowly progressing with mobility, continues to demonstrate cognitive impairments and increased lethargy. Amb to bathroom and in hallway with min guard for balance; decreased short term memory and ability to complete tasks, especially with multi-tasking. Score of 27 sec on Cognitive TUG indicates significant increase in fall risk; pt with increased difficulty performing this task, requiring multiple attempts to complete and cues to count. HR ranging from mid-30s to low-60s. Also demonstrating delayed response to questions. D/c plans updated to recommend 24/7 supervision for safety at home with follow-up for outpatient therapy to address cognitive deficits. Would benefit from follow-up with cardiologist prior to d/c home.    Follow Up Recommendations  No PT follow up;Supervision/Assistance - 24 hour     Equipment Recommendations  None recommended by PT    Recommendations for Other Services Speech consult     Precautions / Restrictions Precautions Precautions: Fall Precaution Comments: Low HR Restrictions Weight Bearing Restrictions: No    Mobility  Bed Mobility Overal bed mobility: Needs Assistance Bed Mobility: Supine to Sit;Sit to Supine     Supine to sit: HOB elevated;Supervision Sit to supine: HOB elevated;Supervision   General bed mobility comments: Supervision for safety, no physical assist  required.  Transfers Overall transfer level: Needs assistance Equipment used: None Transfers: Sit to/from Stand Sit to Stand: Min guard         General transfer comment: Stood from bed, chair and toilet with min guard for balance; decreased ability to multi-task with mobility  Ambulation/Gait Ambulation/Gait assistance: Min guard Ambulation Distance (Feet): 40 Feet (+50) Assistive device: None Gait Pattern/deviations: Decreased stride length;Step-through pattern Gait velocity: Decreased Gait velocity interpretation: Below normal speed for age/gender General Gait Details: Amb to/from bathroom and in hallway with min guard for balance. 1x standing rest break which pt was unable to state why she wanted to rest. Decreased ability to follow multi-step commands and multi-task with amb; able to find a room number and navigate back to her own room.   Stairs            Wheelchair Mobility    Modified Rankin (Stroke Patients Only) Modified Rankin (Stroke Patients Only) Pre-Morbid Rankin Score: No symptoms Modified Rankin: Moderately severe disability     Balance Overall balance assessment: Needs assistance Sitting-balance support: No upper extremity supported;Feet supported;Feet unsupported Sitting balance-Leahy Scale: Good Sitting balance - Comments: Able to don socks at EOB indep   Standing balance support: No upper extremity supported;During functional activity   Standing balance comment: Min guard for safety in standing.                 Standardized Balance Assessment Standardized Balance Assessment : TUG: Timed Up and Go Test     Timed Up and Go Test TUG: Normal TUG;Cognitive TUG Normal TUG (seconds): 21 Cognitive TUG (seconds): 27 (with repeated cues to count)    Cognition Arousal/Alertness: Lethargic Behavior During Therapy: Flat affect Overall Cognitive Status: Impaired/Different from  baseline Area of Impairment: Attention;Memory;Following  commands;Safety/judgement;Awareness;Problem solving                   Current Attention Level: Selective Memory: Decreased short-term memory Following Commands: Follows one step commands with increased time;Follows multi-step commands inconsistently Safety/Judgement: Decreased awareness of deficits Awareness: Emergent Problem Solving: Slow processing;Difficulty sequencing;Requires verbal cues General Comments: Administered MOCA with score ot 18/30.      Exercises      General Comments General comments (skin integrity, edema, etc.): HR 30s at rest, up to 60s with mobility      Pertinent Vitals/Pain Pain Assessment: Faces Pain Score: 5  Faces Pain Scale: Hurts little more Pain Location: Head Pain Descriptors / Indicators: Headache;Dull Pain Intervention(s): Monitored during session;Premedicated before session;Repositioned    Home Living                      Prior Function            PT Goals (current goals can now be found in the care plan section) Acute Rehab PT Goals Patient Stated Goal: Return to work PT Goal Formulation: With patient Time For Goal Achievement: 08/08/16 Potential to Achieve Goals: Good Progress towards PT goals: Progressing toward goals    Frequency    Min 4X/week      PT Plan Discharge plan needs to be updated    Co-evaluation              AM-PAC PT "6 Clicks" Daily Activity  Outcome Measure  Difficulty turning over in bed (including adjusting bedclothes, sheets and blankets)?: None Difficulty moving from lying on back to sitting on the side of the bed? : None Difficulty sitting down on and standing up from a chair with arms (e.g., wheelchair, bedside commode, etc,.)?: None Help needed moving to and from a bed to chair (including a wheelchair)?: None Help needed walking in hospital room?: A Little Help needed climbing 3-5 steps with a railing? : A Little 6 Click Score: 22    End of Session Equipment Utilized  During Treatment: Gait belt Activity Tolerance: Patient tolerated treatment well Patient left: in bed;with call bell/phone within reach   PT Visit Diagnosis: Unsteadiness on feet (R26.81);Other symptoms and signs involving the nervous system (R29.898)     Time: 1505-6979 PT Time Calculation (min) (ACUTE ONLY): 24 min  Charges:  $Gait Training: 8-22 mins $Neuromuscular Re-education: 8-22 mins                    G Codes:      Enis Gash, SPT [phone]  Mabeline Caras 07/26/2016, 11:39 AM

## 2016-07-26 NOTE — Progress Notes (Addendum)
Dr. Kathyrn Sheriff called & notified of interdisciplinary concerns regarding patients discharge.  This RN, PT, & OT all spoke to MD.  Concerns regarding HR sustaining mid 30's at rest.  Also, cognitive delay present.  MD verbalized pt is stable for discharge with no further workup needed.

## 2016-07-26 NOTE — Progress Notes (Addendum)
Nancy Cradle, PA w/ Electrophysiology, notified of interdisciplinary concerns regarding pt discharge with HR sustaining mid 30's.  PA stated she would get in contact with Dr. Rayann Heman to follow up.

## 2016-07-26 NOTE — Discharge Summary (Signed)
  Physician Discharge Summary  Patient ID: Nancy Walker MRN: [id number] DOB/AGE: [birth year] 51 y.o.  Admit date: 07/20/2016 Discharge date: 07/26/2016  Admission Diagnoses:  Frontal lobe contusion  Discharge Diagnoses:  Post-concussive syndrome Frontal lobe contusion Active Problems:   Frontal lobe contusion [facility])   Bradycardia   Discharged Condition: Stable  Hospital Course:  Collier Monica is a 51 y.o. female admitted after a fall with right frontal contusion. She was observed in the ICU and was relatively lethargic but neurologically intact. Her lethargy slowly improved. She was found to have sinus bradycardia. Cardiology was consulted and it was felt to be reactive.  Discharge Exam: Blood pressure 111/71, pulse (!) 39, temperature 98.5 F (36.9 C), temperature source Oral, resp. rate 11, height 5\' 7"  (1.702 m), weight 61.1 kg (134 lb 12.8 oz), SpO2 96 %. Awake, alert, oriented Speech fluent, appropriate CN grossly intact 5/5 BUE/BLE Wound c/d/i  Disposition: Home  Discharge Instructions    Call MD for:  temperature >100.4    Complete by:  As directed    Diet - low sodium heart healthy    Complete by:  As directed    Discharge instructions    Complete by:  As directed    Walk at home as much as possible, at least 4 times / day   Increase activity slowly    Complete by:  As directed    Lifting restrictions    Complete by:  As directed    No lifting > 10 lbs   May shower / Bathe    Complete by:  As directed    48 hours after surgery   May walk up steps    Complete by:  As directed    No dressing needed    Complete by:  As directed    Other Restrictions    Complete by:  As directed    No bending/twisting at waist     Allergies as of 07/26/2016      Reactions   Latex    Penicillins    Steri-strip Compound Benzoin [benzoin Compound]       Medication List    TAKE these medications   sertraline 100 MG tablet Commonly known as:  ZOLOFT Take 200 mg by mouth  daily.      Follow-up Information    Ysabel Cowgill, C, MD. Schedule an appointment as soon as possible for a visit in 4 week(s).   Specialty:  Neurosurgery Contact information: [address] [place] [address] [phone]        Thompson Grayer, MD Follow up in 2 week(s).   Specialty:  Cardiology Contact information: [place] [address] [place] [address] [phone]           Signed: Jairo Ben 07/26/2016, 10:54 AM

## 2016-07-26 NOTE — Progress Notes (Signed)
Dr. Madelaine Bhat office notified of need for outpatient occupational, speech therapy orders.

## 2016-07-26 NOTE — Care Management Note (Addendum)
Case Management Note Original Note Created by Ellan Lambert Patient Details  Name: Nancy Walker MRN: [id number] Date of Birth: [birth year]  Subjective/Objective:   51 y.o. female with multiple lobe contusions, small amount of SDH, nondisplaced left temporal bone fracture s/p fall.  PTA, pt independent, lives with spouse.                   Action/Plan: Will follow for discharge planning as pt progresses.  Recommend PT/OT consults when pt able to tolerate therapies.    Expected Discharge Date:  07/26/16               Expected Discharge Plan:  [place]  In-House Referral:     Discharge planning Services  CM Consult  Post Acute Care Choice:    Choice offered to:  Spouse  DME Arranged:    DME Agency:     HH Arranged:  OT [place] Agency:  [facility]  Status of Service:  Completed, signed off  If discussed at [place] of Stay Meetings, dates discussed:    Additional Comments:  07/26/2016  Pt deemed medically ready for discharge home by both attending and in house therapies.  CM voiced concerns to therapy (bedside nurse reported that she voiced concerns directly to attending) regarding therapy assessment documentation dated today ; continues to demonstrate cognitive impairments and increased lethargy, Score of 27 sec on Cognitive TUG indicates significant increase in fall risk; pt with increased difficulty performing this task, requiring multiple attempts to complete and cues to count. HR  - CM assured that pt is safe to discharge home vs possible SNF placement.  CM spoke with husband - husband voiced he doesn't have any concerns with pt discharging home with him, acknowledges that pt is not at baseline but he feels that home is a safe discharge and confirmed he will provide 24 hour supervision as recommended.  CM offered HH choice - chose [facility] - ordered requested - agency contacted and referral accepted.  07/24/16 Pt now on SD.  Remains bradycardia and not at baseline  mentally.  Will benefit from PT/OT eval - requested from attending Elenor Quinones, RN, BSN [phone]

## 2016-08-03 ENCOUNTER — Encounter: Payer: Self-pay | Admitting: Internal Medicine

## 2016-08-07 ENCOUNTER — Ambulatory Visit (INDEPENDENT_AMBULATORY_CARE_PROVIDER_SITE_OTHER): Payer: BLUE CROSS/BLUE SHIELD | Admitting: Internal Medicine

## 2016-08-07 ENCOUNTER — Encounter (INDEPENDENT_AMBULATORY_CARE_PROVIDER_SITE_OTHER): Payer: Self-pay

## 2016-08-07 ENCOUNTER — Encounter: Payer: Self-pay | Admitting: Internal Medicine

## 2016-08-07 VITALS — BP 118/72 | HR 60 | Ht 67.0 in | Wt 133.6 lb

## 2016-08-07 DIAGNOSIS — R001 Bradycardia, unspecified: Secondary | ICD-10-CM | POA: Diagnosis not present

## 2016-08-07 NOTE — Patient Instructions (Addendum)
Medication Instructions:  Your physician recommends that you continue on your current medications as directed. Please refer to the Current Medication list given to you today.  Follow-Up: Your physician wants you to follow-up in: 6 weeks with Dr. Rayann Heman.    Any Other Special Instructions Will Be Listed Below (If Applicable).     If you need a refill on your cardiac medications before your next appointment, please call your pharmacy.

## 2016-08-07 NOTE — Progress Notes (Signed)
PCP: Aretta Nip, MD  Nancy Walker is a 51 y.o. female who presents today for routine electrophysiology followup.  She is making good progress since her hospital discharge s/p fall/ ICH.  She has actually returned to teaching preschool.  + headaches but feels that cognitive function is better.  Today, she denies symptoms of palpitations, chest pain, shortness of breath,  lower extremity edema, dizziness, presyncope, or syncope.  The patient is otherwise without complaint today.   Past Medical History:  Diagnosis Date  . Anxiety   . Depression    Past Surgical History:  Procedure Laterality Date  . CESAREAN SECTION      ROS- all systems are reviewed and negatives except as per HPI above  Current Outpatient Prescriptions  Medication Sig Dispense Refill  . sertraline (ZOLOFT) 100 MG tablet Take 150 mg by mouth daily.   2   No current facility-administered medications for this visit.     Physical Exam: Vitals:   08/07/16 1340  BP: 118/72  Pulse: 60  SpO2: 97%  Weight: 133 lb 9.6 oz (60.6 kg)  Height: 5\' 7"  (1.702 m)    GEN- The patient is well appearing, alert and oriented x 3 today.   Head- normocephalic, atraumatic Eyes-  Sclera clear, conjunctiva pink Ears- hearing intact Oropharynx- clear Lungs- Clear to ausculation bilaterally, normal work of breathing Heart- Regular rate and rhythm, no murmurs, rubs or gallops, PMI not laterally displaced GI- soft, NT, ND, + BS Extremities- no clubbing, cyanosis, or edema  ekg today reveals sinus rhythm 54 bpm, otherwise normal ekg  Assessment and Plan:  1. Sinus bradycardia Appears to be resolving with improvements of medical state post ICH Asymptomatic  No changes today Return to see me in 6 weeks  Thompson Grayer MD, [facility] 08/07/2016 2:25 PM

## 2016-08-09 DIAGNOSIS — F329 Major depressive disorder, single episode, unspecified: Secondary | ICD-10-CM | POA: Diagnosis not present

## 2016-08-09 DIAGNOSIS — W19XXXD Unspecified fall, subsequent encounter: Secondary | ICD-10-CM | POA: Diagnosis not present

## 2016-08-09 DIAGNOSIS — S065X0D Traumatic subdural hemorrhage without loss of consciousness, subsequent encounter: Secondary | ICD-10-CM | POA: Diagnosis not present

## 2016-08-09 DIAGNOSIS — S0219XD Other fracture of base of skull, subsequent encounter for fracture with routine healing: Secondary | ICD-10-CM | POA: Diagnosis not present

## 2016-08-09 DIAGNOSIS — R001 Bradycardia, unspecified: Secondary | ICD-10-CM | POA: Diagnosis not present

## 2016-08-09 DIAGNOSIS — F419 Anxiety disorder, unspecified: Secondary | ICD-10-CM | POA: Diagnosis not present

## 2016-08-11 ENCOUNTER — Encounter: Payer: Self-pay | Admitting: Internal Medicine

## 2016-09-01 ENCOUNTER — Encounter: Payer: Self-pay | Admitting: Obstetrics and Gynecology

## 2016-09-15 ENCOUNTER — Encounter: Payer: 59 | Admitting: Gastroenterology

## 2016-10-19 ENCOUNTER — Telehealth: Payer: Self-pay | Admitting: Internal Medicine

## 2016-10-19 NOTE — Telephone Encounter (Signed)
Per pt feels fine wanted to know if needs f/u . Will forward to Dr Rayann Heman .

## 2016-10-19 NOTE — Telephone Encounter (Signed)
New Message  Pt would like to speak with RN to see if she would need to come back for the f/u appt in 6 weeks with Dr. Rayann Heman. Please call back to discuss

## 2016-10-20 NOTE — Telephone Encounter (Signed)
Pt aware to call if needed ./cy

## 2016-10-20 NOTE — Telephone Encounter (Signed)
I am happy to see her as needed.  I am glad that she is better.

## 2016-12-18 DIAGNOSIS — I83892 Varicose veins of left lower extremities with other complications: Secondary | ICD-10-CM | POA: Diagnosis not present

## 2016-12-18 DIAGNOSIS — I8312 Varicose veins of left lower extremity with inflammation: Secondary | ICD-10-CM | POA: Diagnosis not present

## 2017-01-02 DIAGNOSIS — I8312 Varicose veins of left lower extremity with inflammation: Secondary | ICD-10-CM | POA: Diagnosis not present

## 2017-04-02 DIAGNOSIS — I83812 Varicose veins of left lower extremities with pain: Secondary | ICD-10-CM | POA: Diagnosis not present

## 2017-04-02 DIAGNOSIS — I8312 Varicose veins of left lower extremity with inflammation: Secondary | ICD-10-CM | POA: Diagnosis not present

## 2017-06-04 DIAGNOSIS — F411 Generalized anxiety disorder: Secondary | ICD-10-CM | POA: Diagnosis not present

## 2017-07-02 DIAGNOSIS — Z01419 Encounter for gynecological examination (general) (routine) without abnormal findings: Secondary | ICD-10-CM | POA: Diagnosis not present

## 2017-07-02 DIAGNOSIS — Z1389 Encounter for screening for other disorder: Secondary | ICD-10-CM | POA: Diagnosis not present

## 2017-07-02 DIAGNOSIS — Z130 Encounter for screening for diseases of the blood and blood-forming organs and certain disorders involving the immune mechanism: Secondary | ICD-10-CM | POA: Diagnosis not present

## 2017-07-02 DIAGNOSIS — Z6821 Body mass index (BMI) 21.0-21.9, adult: Secondary | ICD-10-CM | POA: Diagnosis not present

## 2017-07-25 DIAGNOSIS — Z1231 Encounter for screening mammogram for malignant neoplasm of breast: Secondary | ICD-10-CM | POA: Diagnosis not present

## 2017-08-03 DIAGNOSIS — J209 Acute bronchitis, unspecified: Secondary | ICD-10-CM | POA: Diagnosis not present

## 2017-08-03 DIAGNOSIS — M791 Myalgia, unspecified site: Secondary | ICD-10-CM | POA: Diagnosis not present

## 2017-08-03 DIAGNOSIS — J029 Acute pharyngitis, unspecified: Secondary | ICD-10-CM | POA: Diagnosis not present

## 2017-08-03 DIAGNOSIS — F411 Generalized anxiety disorder: Secondary | ICD-10-CM | POA: Diagnosis not present

## 2017-08-04 IMAGING — CT CT HEAD W/O CM
3 of 4 series · 13 of 47 positions shown, 15 images · non-contrast
Comparison: 07/20/2016.

CLINICAL DATA: Continued surveillance intracranial hemorrhage.

EXAM:
CT HEAD WITHOUT CONTRAST
TECHNIQUE: Contiguous axial images were obtained from the base of the skull
through the vertex without intravenous contrast.

[Series 3: head without · axial · non-contrast · 0.39mm/px · z∈[-99,+1]mm · 7 of 28 slices shown, 9 images]
[im 4/28  brain]
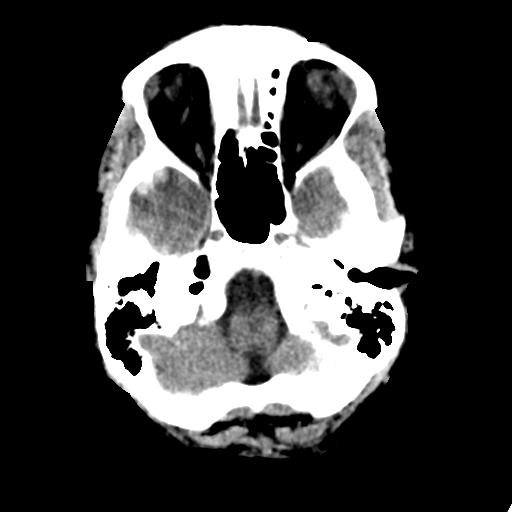
[im 4/28  bone]
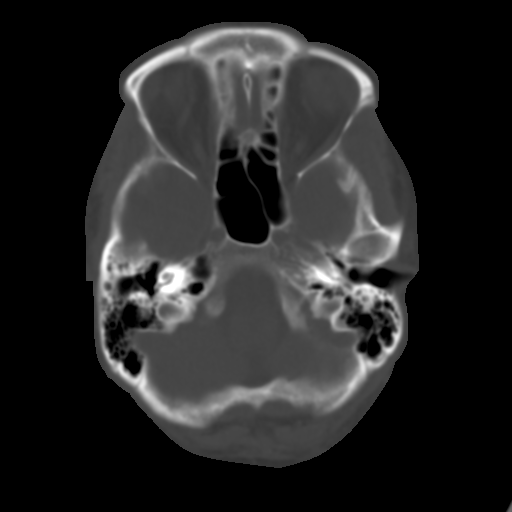
[im 7/28  brain]
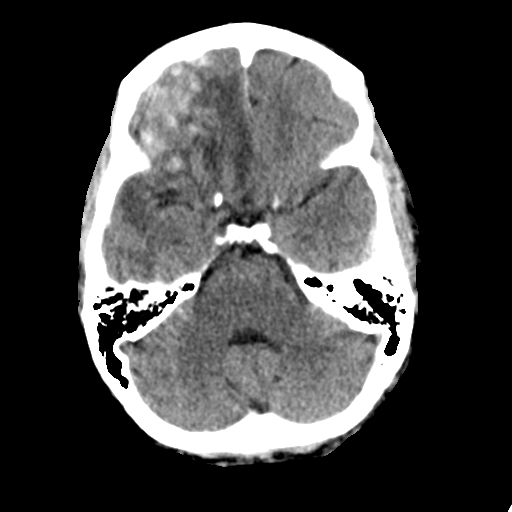
[im 11/28  brain]
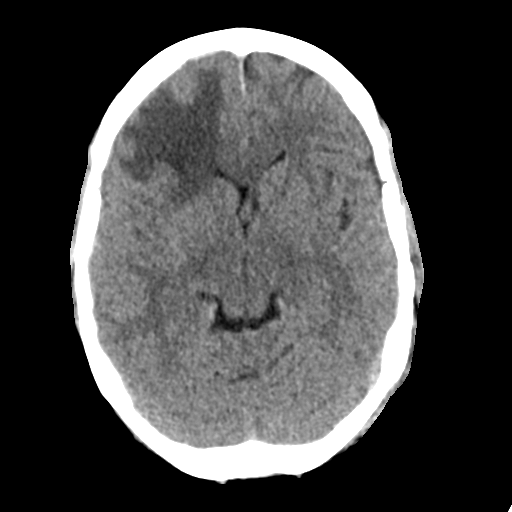
[im 14/28  brain]
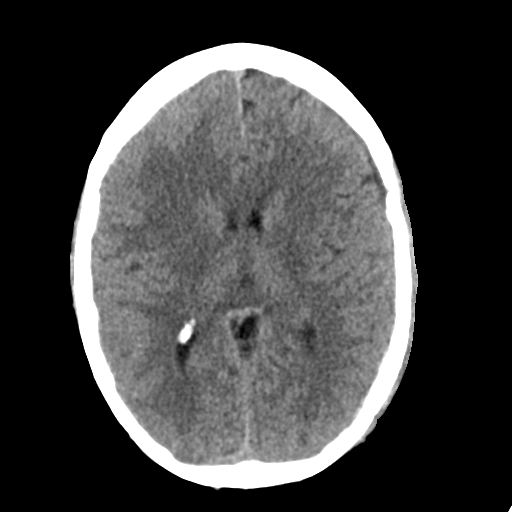
[im 17/28  brain]
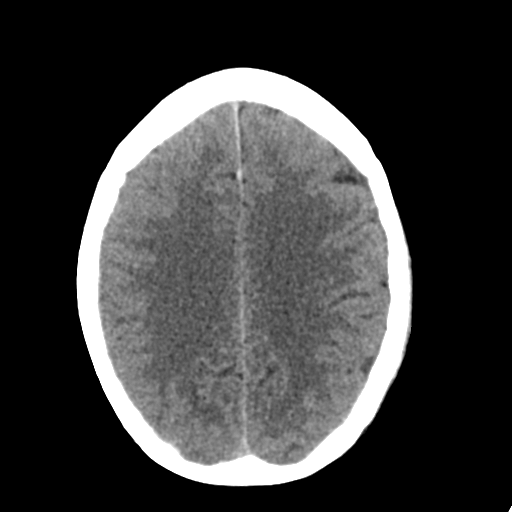
[im 17/28  bone]
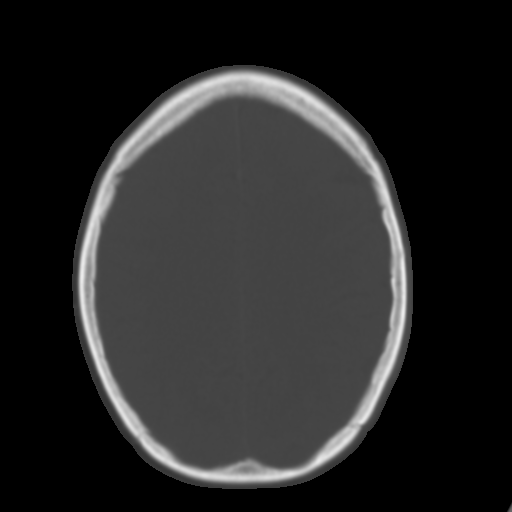
[im 21/28  brain]
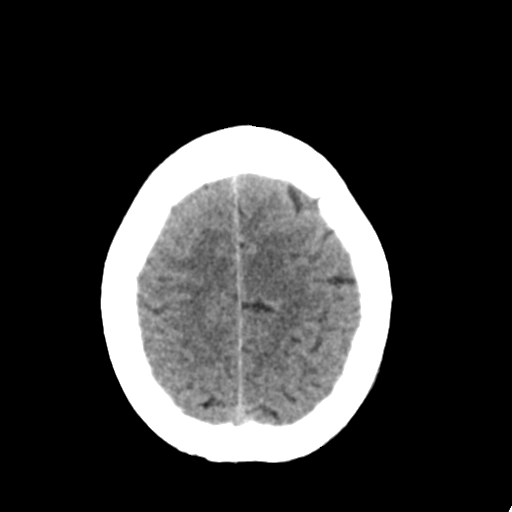
[im 24/28  brain]
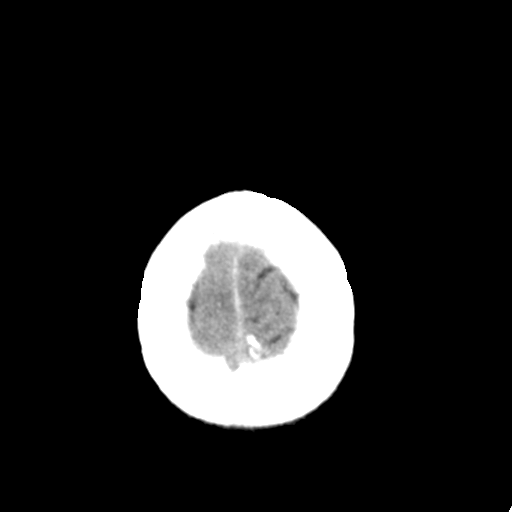

[Series 5: head without cor · coronal · non-contrast · 0.27mm/px · 3 of 67 slices shown]
[im 23/67  brain]
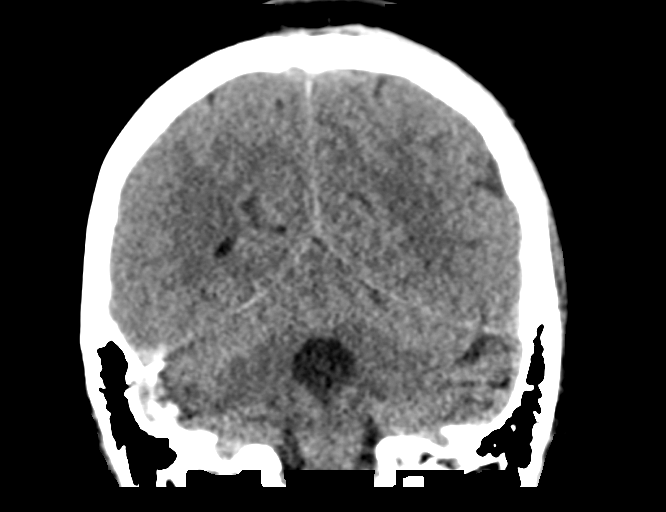
[im 30/67  brain]
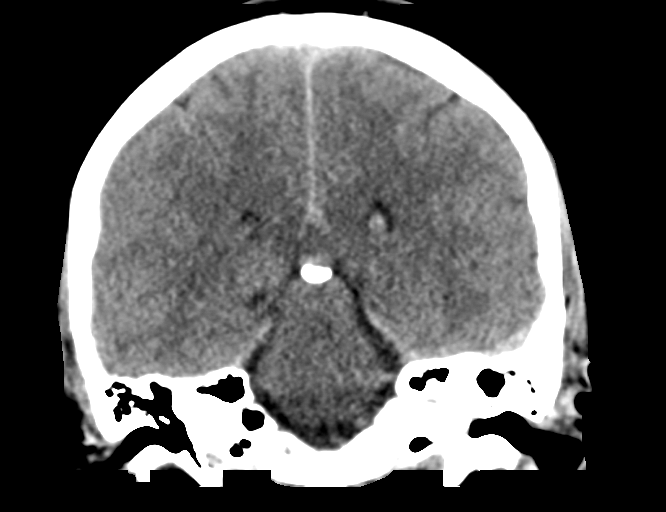
[im 37/67  brain]
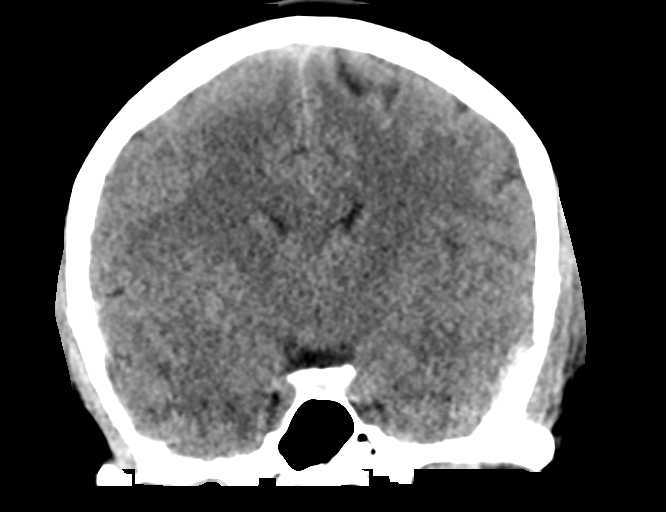

[Series 6: head without sag · sagittal · non-contrast · 0.27mm/px · 3 of 60 slices shown]
[im 20/60  brain]
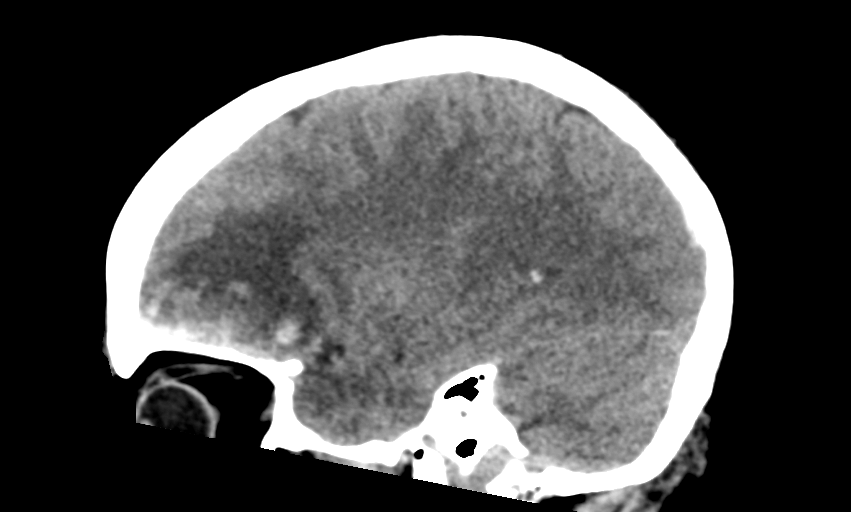
[im 30/60  brain]
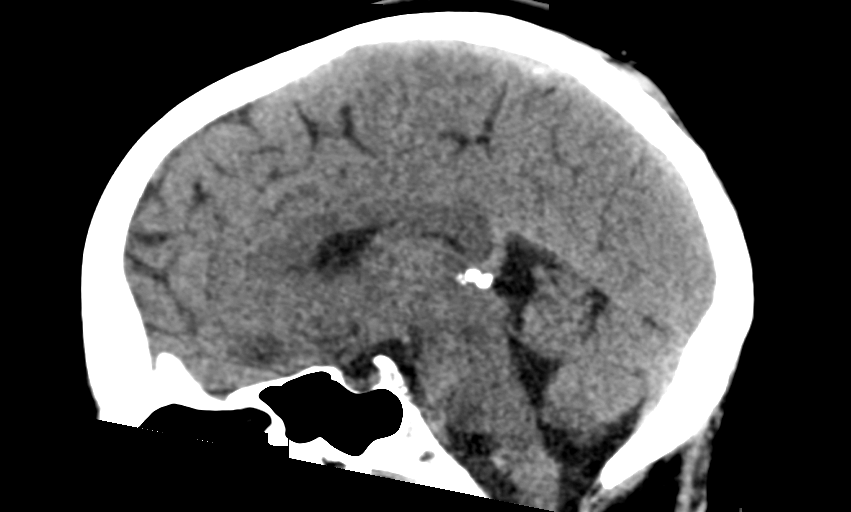
[im 40/60  brain]
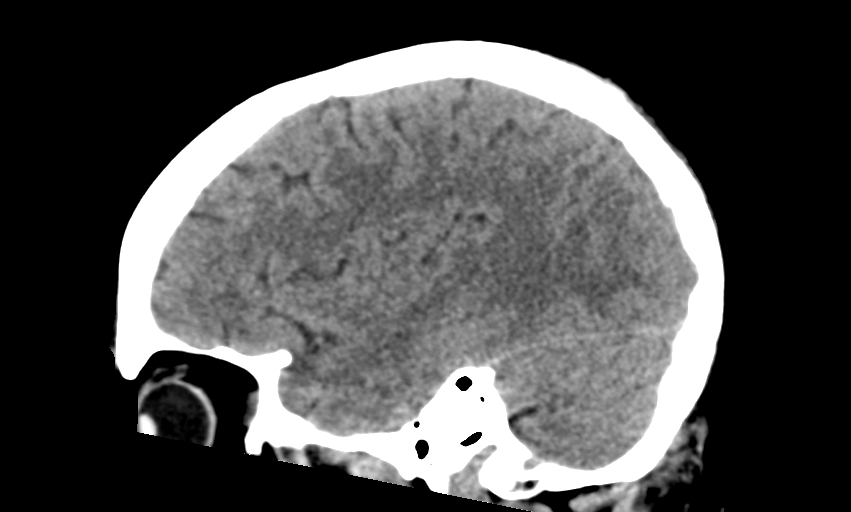

[13 of 47 positions shown; findings below may reference images not displayed]

FINDINGS: Brain: LEFT middle cranial fossa epidural hematoma is improved, now
4 mm in thickness. Slight mass effect on the LEFT temporal lobe is
correspondingly decreased.

Large hemorrhagic contusion RIGHT frontal and temporal lobes,
without confluent lobar hematoma, but with significant surrounding
edema. Slight subfrontal RIGHT-to-LEFT mass effect, estimated 2 mm.
No uncal herniation.

Resolved pneumocephalus. Resolved or resolving thin subdural over
the convexity.

Vascular: No hyperdense vessel or unexpected calcification.

Skull: Nondepressed squamous LEFT temporal bone fracture
redemonstrated. Diastasis LEFT lambdoid suture redemonstrated, with
slight inward displacement of the bony fragment.

Sinuses/Orbits: Moderate opacity in the ethmoid sinuses. No layering
fluid in the sphenoid.

Other: Trace amount of fluid LEFT mastoid air cells. Fluid and/or
blood in the LEFT middle ear, correlate with hemotympanum. Improving
posterior scalp soft tissue swelling.
IMPRESSION: Improved LEFT middle cranial fossa epidural hematoma.

No progression of the large hemorrhagic contusion in the RIGHT
frontal and temporal lobes, without confluent lobar hematoma but
with significant surrounding edema and 2 mm RIGHT-to-LEFT shift.

## 2017-10-03 DIAGNOSIS — F411 Generalized anxiety disorder: Secondary | ICD-10-CM | POA: Diagnosis not present

## 2018-05-12 DIAGNOSIS — J0100 Acute maxillary sinusitis, unspecified: Secondary | ICD-10-CM | POA: Diagnosis not present

## 2018-09-17 DIAGNOSIS — Z1389 Encounter for screening for other disorder: Secondary | ICD-10-CM | POA: Diagnosis not present

## 2018-09-17 DIAGNOSIS — Z6822 Body mass index (BMI) 22.0-22.9, adult: Secondary | ICD-10-CM | POA: Diagnosis not present

## 2018-09-17 DIAGNOSIS — Z1231 Encounter for screening mammogram for malignant neoplasm of breast: Secondary | ICD-10-CM | POA: Diagnosis not present

## 2018-09-17 DIAGNOSIS — Z01419 Encounter for gynecological examination (general) (routine) without abnormal findings: Secondary | ICD-10-CM | POA: Diagnosis not present

## 2018-09-17 DIAGNOSIS — Z130 Encounter for screening for diseases of the blood and blood-forming organs and certain disorders involving the immune mechanism: Secondary | ICD-10-CM | POA: Diagnosis not present

## 2018-09-19 ENCOUNTER — Other Ambulatory Visit: Payer: Self-pay | Admitting: Obstetrics and Gynecology

## 2018-09-19 DIAGNOSIS — M810 Age-related osteoporosis without current pathological fracture: Secondary | ICD-10-CM

## 2018-10-10 ENCOUNTER — Ambulatory Visit
Admission: RE | Admit: 2018-10-10 | Discharge: 2018-10-10 | Disposition: A | Discharge status: Home / Self Care | Payer: BC Managed Care – PPO | Source: Ambulatory Visit | Attending: Obstetrics and Gynecology | Admitting: Obstetrics and Gynecology

## 2018-10-10 ENCOUNTER — Other Ambulatory Visit: Payer: Self-pay

## 2018-10-10 DIAGNOSIS — M85851 Other specified disorders of bone density and structure, right thigh: Secondary | ICD-10-CM | POA: Diagnosis not present

## 2018-10-10 DIAGNOSIS — Z780 Asymptomatic menopausal state: Secondary | ICD-10-CM | POA: Diagnosis not present

## 2018-10-10 DIAGNOSIS — M810 Age-related osteoporosis without current pathological fracture: Secondary | ICD-10-CM

## 2019-11-24 DIAGNOSIS — Z20828 Contact with and (suspected) exposure to other viral communicable diseases: Secondary | ICD-10-CM | POA: Diagnosis not present

## 2019-11-24 DIAGNOSIS — R05 Cough: Secondary | ICD-10-CM | POA: Diagnosis not present

## 2019-11-25 DIAGNOSIS — Z20828 Contact with and (suspected) exposure to other viral communicable diseases: Secondary | ICD-10-CM | POA: Diagnosis not present

## 2019-11-25 DIAGNOSIS — R05 Cough: Secondary | ICD-10-CM | POA: Diagnosis not present

## 2020-05-18 DIAGNOSIS — Z01419 Encounter for gynecological examination (general) (routine) without abnormal findings: Secondary | ICD-10-CM | POA: Diagnosis not present

## 2020-05-18 DIAGNOSIS — Z124 Encounter for screening for malignant neoplasm of cervix: Secondary | ICD-10-CM | POA: Diagnosis not present

## 2020-05-18 DIAGNOSIS — Z130 Encounter for screening for diseases of the blood and blood-forming organs and certain disorders involving the immune mechanism: Secondary | ICD-10-CM | POA: Diagnosis not present

## 2020-05-18 DIAGNOSIS — Z1151 Encounter for screening for human papillomavirus (HPV): Secondary | ICD-10-CM | POA: Diagnosis not present

## 2020-05-18 DIAGNOSIS — Z1231 Encounter for screening mammogram for malignant neoplasm of breast: Secondary | ICD-10-CM | POA: Diagnosis not present

## 2020-05-19 ENCOUNTER — Other Ambulatory Visit: Payer: Self-pay | Admitting: Obstetrics and Gynecology

## 2020-05-19 DIAGNOSIS — M810 Age-related osteoporosis without current pathological fracture: Secondary | ICD-10-CM

## 2020-05-19 DIAGNOSIS — Z124 Encounter for screening for malignant neoplasm of cervix: Secondary | ICD-10-CM | POA: Diagnosis not present

## 2020-05-19 DIAGNOSIS — Z1151 Encounter for screening for human papillomavirus (HPV): Secondary | ICD-10-CM | POA: Diagnosis not present

## 2020-06-07 DIAGNOSIS — F411 Generalized anxiety disorder: Secondary | ICD-10-CM | POA: Diagnosis not present

## 2020-07-08 DIAGNOSIS — F411 Generalized anxiety disorder: Secondary | ICD-10-CM | POA: Diagnosis not present

## 2020-08-02 DIAGNOSIS — J029 Acute pharyngitis, unspecified: Secondary | ICD-10-CM | POA: Diagnosis not present

## 2020-10-27 ENCOUNTER — Ambulatory Visit
Admission: RE | Admit: 2020-10-27 | Discharge: 2020-10-27 | Disposition: A | Discharge status: Home / Self Care | Payer: Self-pay | Source: Ambulatory Visit | Attending: Obstetrics and Gynecology | Admitting: Obstetrics and Gynecology

## 2020-10-27 ENCOUNTER — Other Ambulatory Visit: Payer: Self-pay

## 2020-10-27 DIAGNOSIS — M85851 Other specified disorders of bone density and structure, right thigh: Secondary | ICD-10-CM | POA: Diagnosis not present

## 2020-10-27 DIAGNOSIS — M810 Age-related osteoporosis without current pathological fracture: Secondary | ICD-10-CM

## 2020-10-27 DIAGNOSIS — Z780 Asymptomatic menopausal state: Secondary | ICD-10-CM | POA: Diagnosis not present

## 2020-11-01 DIAGNOSIS — I83812 Varicose veins of left lower extremities with pain: Secondary | ICD-10-CM | POA: Diagnosis not present

## 2020-11-01 DIAGNOSIS — I8312 Varicose veins of left lower extremity with inflammation: Secondary | ICD-10-CM | POA: Diagnosis not present

## 2020-12-22 DIAGNOSIS — Z23 Encounter for immunization: Secondary | ICD-10-CM | POA: Diagnosis not present

## 2021-01-03 DIAGNOSIS — I8312 Varicose veins of left lower extremity with inflammation: Secondary | ICD-10-CM | POA: Diagnosis not present

## 2021-01-18 DIAGNOSIS — I83892 Varicose veins of left lower extremities with other complications: Secondary | ICD-10-CM | POA: Diagnosis not present

## 2021-01-18 DIAGNOSIS — I83812 Varicose veins of left lower extremities with pain: Secondary | ICD-10-CM | POA: Diagnosis not present

## 2021-01-18 DIAGNOSIS — I8312 Varicose veins of left lower extremity with inflammation: Secondary | ICD-10-CM | POA: Diagnosis not present

## 2021-02-02 DIAGNOSIS — M810 Age-related osteoporosis without current pathological fracture: Secondary | ICD-10-CM | POA: Diagnosis not present

## 2021-12-12 ENCOUNTER — Emergency Department (HOSPITAL_COMMUNITY): Payer: Commercial Managed Care - HMO

## 2021-12-12 ENCOUNTER — Emergency Department (HOSPITAL_COMMUNITY)
Admission: EM | Admit: 2021-12-12 | Discharge: 2021-12-13 | Disposition: A | Discharge status: Home / Self Care | Payer: Commercial Managed Care - HMO | Attending: Emergency Medicine | Admitting: Emergency Medicine

## 2021-12-12 ENCOUNTER — Encounter (HOSPITAL_COMMUNITY): Payer: Self-pay | Admitting: Emergency Medicine

## 2021-12-12 DIAGNOSIS — Z91040 Latex allergy status: Secondary | ICD-10-CM | POA: Diagnosis not present

## 2021-12-12 DIAGNOSIS — Y9373 Activity, racquet and hand sports: Secondary | ICD-10-CM | POA: Diagnosis not present

## 2021-12-12 DIAGNOSIS — S4992XA Unspecified injury of left shoulder and upper arm, initial encounter: Secondary | ICD-10-CM | POA: Diagnosis present

## 2021-12-12 DIAGNOSIS — S0990XA Unspecified injury of head, initial encounter: Secondary | ICD-10-CM | POA: Diagnosis not present

## 2021-12-12 DIAGNOSIS — W01198A Fall on same level from slipping, tripping and stumbling with subsequent striking against other object, initial encounter: Secondary | ICD-10-CM | POA: Diagnosis not present

## 2021-12-12 DIAGNOSIS — S42202A Unspecified fracture of upper end of left humerus, initial encounter for closed fracture: Secondary | ICD-10-CM | POA: Insufficient documentation

## 2021-12-12 DIAGNOSIS — R110 Nausea: Secondary | ICD-10-CM | POA: Diagnosis not present

## 2021-12-12 DIAGNOSIS — S42292A Other displaced fracture of upper end of left humerus, initial encounter for closed fracture: Secondary | ICD-10-CM

## 2021-12-12 LAB — BASIC METABOLIC PANEL
Anion gap: 7 (ref 5–15)
BUN: 15 mg/dL (ref 6–20)
CO2: 31 mmol/L (ref 22–32)
Calcium: 9.7 mg/dL (ref 8.9–10.3)
Chloride: 103 mmol/L (ref 98–111)
Creatinine, Ser: 0.74 mg/dL (ref 0.44–1.00)
GFR, Estimated: >60 mL/min (ref >60)
Glucose, Bld: 114 mg/dL — ABNORMAL HIGH (ref 70–99)
Potassium: 3.9 mmol/L (ref 3.5–5.1)
Sodium: 141 mmol/L (ref 135–145)

## 2021-12-12 LAB — CBC
HCT: 41.1 % (ref 36.0–46.0)
Hemoglobin: 13.2 g/dL (ref 12.0–15.0)
MCH: 29.4 pg (ref 26.0–34.0)
MCHC: 32.1 g/dL (ref 30.0–36.0)
MCV: 91.5 fL (ref 80.0–100.0)
Platelets: 215 10*3/uL (ref 150–400)
RBC: 4.49 MIL/uL (ref 3.87–5.11)
RDW: 12.5 % (ref 11.5–15.5)
WBC: 8.1 10*3/uL (ref 4.0–10.5)
nRBC: 0 % (ref 0.0–0.2)

## 2021-12-12 MED ORDER — OXYCODONE-ACETAMINOPHEN 5-325 MG PO TABS
1.0000 | ORAL_TABLET | Freq: Once | ORAL | Status: AC
  Administered 2021-12-12: 1 via ORAL
  Filled 2021-12-12: qty 1

## 2021-12-12 MED ORDER — OXYCODONE-ACETAMINOPHEN 5-325 MG PO TABS: 1.0000 | ORAL_TABLET | Freq: Four times a day (QID) | ORAL | 0 refills | Status: DC | PRN

## 2021-12-12 NOTE — ED Provider Notes (Signed)
[facility] DEPT Provider Note   CSN: [id number] Arrival date & time: 12/12/21  1930     History  Chief Complaint  Patient presents with   Shoulder Pain    Nancy Walker is a 56 y.o. female.  Patient with no pertinent past medical history presents today with complaints of fall.  She states that earlier today she was playing pickle ball and tripped and fell struck her left shoulder.  She endorses associated pain to same.  She is unsure if she hit her head, but denies any loss of consciousness.  She is not anticoagulated.  States that she has been slightly nauseous but denies any vomiting. She also denies headache, dizziness, lightheadedness, or blurred vision. No neck pain.  The history is provided by the patient. No language interpreter was used.  Shoulder Pain      Home Medications Prior to Admission medications   Medication Sig Start Date End Date Taking? Authorizing Provider  sertraline (ZOLOFT) 100 MG tablet Take 150 mg by mouth daily.  05/13/16   [provider]      Allergies    Latex, Steri-strip compound benzoin [benzoin compound], and Penicillins    Review of Systems   Review of Systems  Musculoskeletal:  Positive for arthralgias.  All other systems reviewed and are negative.   Physical Exam Updated Vital Signs BP 130/66   Pulse (!) 52   Temp 97.6 F (36.4 C) (Oral)   Resp 17   Ht '5\' 7"'$  (1.702 m)   Wt 63.5 kg   SpO2 100%   BMI 21.93 kg/m  Physical Exam Vitals and nursing note reviewed.  Constitutional:      General: She is not in acute distress.    Appearance: Normal appearance. She is normal weight. She is not ill-appearing, toxic-appearing or diaphoretic.  HENT:     Head: Normocephalic and atraumatic.     Comments: No Battle's sign or racoon eyes Eyes:     Extraocular Movements: Extraocular movements intact.     Pupils: Pupils are equal, round, and reactive to light.  Cardiovascular:     Rate and Rhythm:  Normal rate.  Pulmonary:     Effort: Pulmonary effort is normal. No respiratory distress.  Abdominal:     General: Abdomen is flat.     Palpations: Abdomen is soft.  Musculoskeletal:        General: Normal range of motion.     Cervical back: Normal range of motion and neck supple. No tenderness.     Comments: Tenderness to palpation of the left humoral head with associated swelling. ROM very limited due to pain. Radial pulse intact and 2+. ROM intact to the left wrist and elbow without pain  Skin:    General: Skin is warm and dry.  Neurological:     General: No focal deficit present.     Mental Status: She is alert.  Psychiatric:        Mood and Affect: Mood normal.        Behavior: Behavior normal.     ED Results / Procedures / Treatments   Labs (all labs ordered are listed, but only abnormal results are displayed) Labs Reviewed  BASIC METABOLIC PANEL - Abnormal; Notable for the following components:      Result Value   Glucose, Bld 114 (*)    All other components within normal limits  CBC    EKG None  Radiology CT Head Wo Contrast  Result Date: 12/12/2021 CLINICAL DATA:  Head trauma, moderate-severe EXAM: CT HEAD WITHOUT CONTRAST TECHNIQUE: Contiguous axial images were obtained from the base of the skull through the vertex without intravenous contrast. RADIATION DOSE REDUCTION: This exam was performed according to the departmental dose-optimization program which includes automated exposure control, adjustment of the mA and/or kV according to patient size and/or use of iterative reconstruction technique. COMPARISON:  CT head 07/25/2016 BRAIN: BRAIN Right frontal encephalomalacia. No evidence of large-territorial acute infarction. No parenchymal hemorrhage. No mass lesion. No extra-axial collection. No mass effect or midline shift. No hydrocephalus. Basilar cisterns are patent. Vascular: No hyperdense vessel. Skull: No acute fracture or focal lesion. Sinuses/Orbits: Paranasal  sinuses and mastoid air cells are clear. The orbits are unremarkable. Other: None. IMPRESSION: No acute intracranial abnormality. Electronically Signed   By: Iven Finn M.D.   On: 12/12/2021 20:43   DG Shoulder Left  Result Date: 12/12/2021 CLINICAL DATA:  Acute left shoulder pain, fall EXAM: LEFT SHOULDER - 2+ VIEW COMPARISON:  None Available. FINDINGS: Acute comminuted and displaced proximal left humeral fracture involving the greater tuberosity and neck. There is no evidence of arthropathy or other focal bone abnormality. Soft tissues are unremarkable. IMPRESSION: Acute comminuted and displaced proximal left humeral fracture involving the greater tuberosity and neck. Electronically Signed   By: Iven Finn M.D.   On: 12/12/2021 20:41    Procedures Procedures    Medications Ordered in ED Medications  oxyCODONE-acetaminophen (PERCOCET/ROXICET) 5-325 MG per tablet 1 tablet (has no administration in time range)    ED Course/ Medical Decision Making/ A&P                           Medical Decision Making Risk Prescription drug management.   Patient presents today with complaints of other injury from a mechanical fall earlier today.  She is afebrile, nontoxic-appearing, and in no acute distress with reassuring vital signs.  X-ray imaging obtained of the patient's left shoulder which revealed a acute comminuted displaced proximal left humeral fracture involving the greater tuberosity and neck.  I have personally reviewed and interpreted this imaging and agree with radiology interpretation.  Patient denies any other pain or injuries.  I have discussed her fracture with her and have given her a sling, pain medication, and an orthopedic referral for continued evaluation and management of her diagnosis.  I have reviewed PDMP and deemed patient an adequate candidate for short course of narcotics.  She has been educated not to drive or operate heavy machinery after taking this medication.  I  have also emphasized the importance of immobilization of her shoulder.  Patient is understanding and amenable with plan, educated on red flag symptoms of prompt immediate return.  Discharged in stable condition.  Findings and plan of care discussed with supervising physician Dr. Francia Greaves who is in agreement.    Final Clinical Impression(s) / ED Diagnoses Final diagnoses:  Humerus head fracture, left, closed, initial encounter    Rx / DC Orders ED Discharge Orders          Ordered    oxyCODONE-acetaminophen (PERCOCET/ROXICET) 5-325 MG tablet  Every 6 hours PRN        12/12/21 2327          An After Visit Summary was printed and given to the patient.     Nestor Lewandowsky 12/12/21 2329    Valarie Merino, MD 12/13/21 Dyann Kief

## 2021-12-12 NOTE — ED Notes (Signed)
Ortho tech at bedside 

## 2021-12-12 NOTE — Discharge Instructions (Addendum)
As we discussed, you do have a a fracture of your left shoulder that was seen on x-ray. We have given you a sling for management of this which you will need to wear at all times for support.  I have also given you a referral to orthopedics with a number to call to schedule an appointment for continued evaluation and management of your fracture.  Please call them tomorrow.  I have also given you a prescription for a narcotic pain medication for you to take as prescribed as needed for severe pain only.  Please do not drive or operate heavy machinery after taking this medication.  You may take ibuprofen with the Percocet, please do not take Tylenol.  I also recommend rest and ice for additional support.  Return if development of any new or worsening symptoms.

## 2021-12-12 NOTE — ED Triage Notes (Signed)
Pt reports L shoulder pain after a reported mechanical fall. Able to move her L hand and fingers. Does have a scrape on her R elbow, but able to move that side without pain. Also reporting some nausea and not sure if she hit her head. Denies syncope.

## 2021-12-12 NOTE — ED Provider Triage Note (Signed)
Emergency Medicine Provider Triage Evaluation Note  Nancy Walker , a 56 y.o. female  was evaluated in triage.  Pt complains of acute bodily pains following a fall about 45 minutes ago.  Was playing pickle ball.  [place] to the ground on her left shoulder and hit the left side of her head.  Denies LOC.  Fall was witnessed.  With nausea, however has not vomited.  Denies neck pain, dizziness, however feels "off".  He is unable to move the arm, concerned about possible fracture.  Review of Systems  Positive:  Negative: See above  Physical Exam  BP (!) 104/59 (BP Location: Right Arm)   Pulse (!) 53   Temp 97.6 F (36.4 C) (Oral)   Resp 18   Ht '5\' 7"'$  (1.702 m)   Wt 63.5 kg   SpO2 100%   BMI 21.93 kg/m  Gen:   Awake, no distress, AAOx4 Resp:  Normal effort  MSK:   Moves extremities without difficulty with the exception of the left upper extremity due to elicited tenderness.  Tender near the humeral neck/humeral head.  Appears neurovascularly intact. Other:  Tenderness on the left parietal lobe, without obvious hematoma, hemorrhage, wound.  PERRLA.  Overall at baseline per spouse.  Gaze aligned appropriately.  Medical Decision Making  Medically screening exam initiated at 8:06 PM.  Appropriate orders placed.  Jenese Mischke was informed that the remainder of the evaluation will be completed by another provider, this initial triage assessment does not replace that evaluation, and the importance of remaining in the ED until their evaluation is complete.     Prince Rome, PA-C 91/69/45 2008

## 2021-12-13 NOTE — Progress Notes (Signed)
Orthopedic Tech Progress Note Patient Details:  Nancy Walker [birth year] [id number]  Ortho Devices Type of Ortho Device: Sling immobilizer Ortho Device/Splint Location: lue Ortho Device/Splint Interventions: Ordered, Application, Adjustment   Post Interventions Patient Tolerated: Well Instructions Provided: Care of device, Adjustment of device  [name] 12/13/2021, 1:07 AM

## 2022-01-31 ENCOUNTER — Other Ambulatory Visit: Payer: Self-pay | Admitting: *Deleted

## 2022-01-31 DIAGNOSIS — M79605 Pain in left leg: Secondary | ICD-10-CM

## 2022-02-06 NOTE — Progress Notes (Unsigned)
[facility]  ASSESSMENT / PLAN: Nancy Walker is a 56 y.o. female with chronic venous insufficiency of left lower extremity causing swelling skin changes about the ankle (C4 disease).  Venous duplex is significant for prior greater saphenous vein ablation; small saphenous vein large and incompetent.  Varicosities of interest are in the expected distribution of the small saphenous vein in the posterior left ankle. Recommend compression and elevation for symptomatic relief. Patient will follow-up in 3 months after trial of conservative therapy with one of my partners who performs venous interventions.   CHIEF COMPLAINT: Varicosities left lower extremity  HISTORY OF PRESENT ILLNESS: Nancy Walker is a 56 y.o. female referred to clinic for discussion of chronic venous insufficiency.  The patient has had a prior greater saphenous vein ablation and sclerotherapy.  She has noticed a cluster of uncomfortable varicosities about her posterior ankle and calf.  This is been associated with skin changes about the ankle and reticular veins about the ankle.  The patient does report some edema.  Past Medical History:  Diagnosis Date   Anxiety    Depression     Past Surgical History:  Procedure Laterality Date   CESAREAN SECTION      Family History  Problem Relation Age of Onset   Cancer Mother     Social History   Socioeconomic History   Marital status: Married    Spouse name: Not on file   Number of children: Not on file   Years of education: Not on file   Highest education level: Not on file  Occupational History   Occupation: teacher  Tobacco Use   Smoking status: Never   Smokeless tobacco: Never  Vaping Use   Vaping Use: Never used  Substance and Sexual Activity   Alcohol use: No   Drug use: No   Sexual activity: Yes    Partners: Male  Other Topics Concern   Not on file  Social History Narrative   Lives in [place].  Works as a Art therapist.   2 children,  married   Social Determinants of Radio broadcast assistant Strain: Not on file  Food Insecurity: Not on file  Transportation Needs: Not on file  Physical Activity: Not on file  Stress: Not on file  Social Connections: Not on file  Intimate Partner Violence: Not on file    Allergies  Allergen Reactions   Latex     unknown   Steri-Strip Compound Benzoin [Benzoin Compound]     unknown   Penicillins Rash    Current Outpatient Medications  Medication Sig Dispense Refill   oxyCODONE-acetaminophen (PERCOCET/ROXICET) 5-325 MG tablet Take 1 tablet by mouth every 6 (six) hours as needed for severe pain. 15 tablet 0   sertraline (ZOLOFT) 100 MG tablet Take 150 mg by mouth daily.   2   No current facility-administered medications for this visit.    PHYSICAL EXAM There were no vitals filed for this visit.  Well-appearing woman in no acute distress Regular rate and rhythm Unlabored breathing Easily palpable dorsalis pedis pulses Prominent varicosities about the left posterior calf in the distribution of the small saphenous vein Reticular veins about the ankles with some mild chronic venous skin changes   PERTINENT LABORATORY AND RADIOLOGIC DATA  Most recent CBC    Latest Ref Rng & Units 12/12/2021    8:24 PM 07/20/2016    3:56 PM 07/20/2016    1:17 PM  CBC  WBC 4.0 - 10.5 K/uL  8.1  6.4  7.0   Hemoglobin 12.0 - 15.0 g/dL 13.2  12.5  13.4   Hematocrit 36.0 - 46.0 % 41.1  37.9  39.3   Platelets 150 - 400 K/uL 215  157  160      Most recent CMP    Latest Ref Rng & Units 12/12/2021    8:24 PM 07/20/2016    3:56 PM 07/20/2016    1:17 PM  CMP  Glucose 70 - 99 mg/dL 114  111  126   BUN 6 - 20 mg/dL '15  20  22   '$ Creatinine 0.44 - 1.00 mg/dL 0.74  0.60  0.68   Sodium 135 - 145 mmol/L 141  139  140   Potassium 3.5 - 5.1 mmol/L 3.9  4.1  3.8   Chloride 98 - 111 mmol/L 103  106  102   CO2 22 - 32 mmol/L '31  25  28   '$ Calcium 8.9 - 10.3 mg/dL 9.7  8.6   9.3    Left lower extremity venous reflux:  - No evidence of deep vein thrombosis seen in the left lower extremity,  from the common femoral through the popliteal veins.  - No evidence of superficial venous thrombosis in the left lower  extremity.    - The greater saphenous vein was not visualized due to previous ablation.    - Venous reflux is noted in the left popliteal vein.  - Venous reflux is noted in the left short saphenous vein.   Yevonne Aline. Stanford Breed, MD [facility] Phone Number: [phone] 02/06/2022 8:57 PM   Total time spent on preparing this encounter including chart review, data review, collecting history, examining the patient, coordinating care for this new patient, 45 minutes.  Portions of this report may have been transcribed using voice recognition software.  Every effort has been made to ensure accuracy; however, inadvertent computerized transcription errors may still be present.

## 2022-02-07 ENCOUNTER — Encounter: Payer: Self-pay | Admitting: Vascular Surgery

## 2022-02-07 ENCOUNTER — Ambulatory Visit: Payer: Commercial Managed Care - HMO | Admitting: Vascular Surgery

## 2022-02-07 ENCOUNTER — Ambulatory Visit (HOSPITAL_COMMUNITY)
Admission: RE | Admit: 2022-02-07 | Discharge: 2022-02-07 | Disposition: A | Discharge status: Home / Self Care | Payer: Commercial Managed Care - HMO | Source: Ambulatory Visit | Attending: Vascular Surgery | Admitting: Vascular Surgery

## 2022-02-07 VITALS — BP 118/68 | HR 50 | Temp 98.1°F | Resp 20 | Ht 67.0 in | Wt 135.0 lb

## 2022-02-07 DIAGNOSIS — I872 Venous insufficiency (chronic) (peripheral): Secondary | ICD-10-CM | POA: Diagnosis not present

## 2022-02-07 DIAGNOSIS — M79605 Pain in left leg: Secondary | ICD-10-CM | POA: Diagnosis present

## 2022-03-28 ENCOUNTER — Ambulatory Visit: Payer: Self-pay | Admitting: General Surgery

## 2022-03-28 DIAGNOSIS — D122 Benign neoplasm of ascending colon: Secondary | ICD-10-CM

## 2022-03-28 NOTE — H&P (Signed)
REFERRING PHYSICIAN:  Dr Therisa Doyne  PROVIDER:  Monico Blitz, MD  MRN: [id number] DOB: 11-24-1965 DATE OF ENCOUNTER: 03/28/2022  Subjective   Chief Complaint: New Consultation     History of Present Illness: Nancy Walker is a 57 y.o. female who is seen today as an office consultation at the request of Dr. Therisa Doyne for evaluation of New Consultation. .   57 year old female who recently underwent a colonoscopy due to a history of constipation.  She was noted to have a 3 cm mass at her appendiceal orifice.  This was biopsied and showed tubulovillous adenoma with high-grade dysplasia.  Patient denies any surgical history except for C-section.   Review of Systems: A complete review of systems was obtained from the patient.  I have reviewed this information and discussed as appropriate with the patient.  See HPI as well for other ROS.    Medical History: History reviewed. No pertinent past medical history.  There is no problem list on file for this patient.   Past Surgical History:  Procedure Laterality Date   REPEAT CESAREAN SECTION       Allergies  Allergen Reactions   Latex Rash    unknown   Penicillins Rash and Other (See Comments)    Current Outpatient Medications on File Prior to Visit  Medication Sig Dispense Refill   alendronate (FOSAMAX) 70 MG tablet Take 1 tablet by mouth every 7 (seven) days     escitalopram oxalate (LEXAPRO) 10 MG tablet Take 10 mg by mouth once daily     No current facility-administered medications on file prior to visit.    Family History  Problem Relation Age of Onset   Skin cancer Father      Social History   Tobacco Use  Smoking Status Never  Smokeless Tobacco Never     Social History   Socioeconomic History   Marital status: Married  Tobacco Use   Smoking status: Never   Smokeless tobacco: Never  Substance and Sexual Activity   Alcohol use: Yes    Comment: 1-2   Drug use: Never    Objective:    Vitals:    03/28/22 0950  BP: 111/65  Pulse: 61  Temp: 36.7 C (98 F)  SpO2: 97%  Weight: 65.8 kg (145 lb)  Height: 170.2 cm ('5\' 7"'$ )     Exam Gen: NAD Abd: soft    Labs, Imaging and Diagnostic Testing: Colonoscopy and pathology reports reviewed   Assessment and Plan:  Diagnoses and all orders for this visit:  Adenomatous polyp of ascending colon  Other orders -     EXTERNAL PATHOLOGY RESULT -     EXTERNAL COLONOSCOPY RESULT    57 year old female with a new diagnosis of cecal polyp with tubulovillous adenoma and high-grade dysplasia.  We discussed that in polyps larger than 2 cm is a 30% risk of an invasive cancer.  I recommended proceeding with a robotic assisted right colectomy.  We discussed this in detail including timing of surgery and recovery time.  Patient has a tight employment situation and is worried about being off of work.  We will have her follow-up with the surgery schedulers today to determine a date for surgery.  The surgery and anatomy were described to the patient as well as the risks of surgery and the possible complications.  These include: Bleeding, deep abdominal infections and possible wound complications such as hernia and infection, damage to adjacent structures, leak of surgical connections, which can lead to other  surgeries and possibly an ostomy, possible need for other procedures, such as abscess drains in radiology, possible prolonged hospital stay, possible diarrhea from removal of part of the colon, possible constipation from narcotics, possible bowel, bladder or sexual dysfunction if having rectal surgery, prolonged fatigue/weakness or appetite loss, possible early recurrence of of disease, possible complications of their medical problems such as heart disease or arrhythmias or lung problems, death (less than 1%). I believe the patient understands and wishes to proceed with the surgery.   No follow-ups on file.   Rosario Adie, MD Colon and Rectal  Surgery [facility]

## 2022-04-26 NOTE — Patient Instructions (Signed)
SURGICAL WAITING ROOM VISITATION Patients having surgery or a procedure may have no more than 2 support people in the waiting area - these visitors may rotate in the visitor waiting room.   Due to an increase in RSV and influenza rates and associated hospitalizations, children ages 16 and under may not visit patients in [place]. If the patient needs to stay at the hospital during part of their recovery, the visitor guidelines for inpatient rooms apply.  PRE-OP VISITATION  Pre-op nurse will coordinate an appropriate time for 1 support person to accompany the patient in pre-op.  This support person may not rotate.  This visitor will be contacted when the time is appropriate for the visitor to come back in the pre-op area.  Please refer to the [facility] website for the visitor guidelines for Inpatients (after your surgery is over and you are in a regular room).  You are not required to quarantine at this time prior to your surgery. However, you must do this: Hand Hygiene often Do NOT share personal items Notify your provider if you are in close contact with someone who has COVID or you develop fever 100.4 or greater, new onset of sneezing, cough, sore throat, shortness of breath or body aches.  If you test positive for Covid or have been in contact with anyone that has tested positive in the last 10 days please notify you surgeon.      Your procedure is scheduled on:  Wednesday  May 10, 2022  Report to [name] Main Entrance: [name] entrance where the [employer] is available.   Report to admitting at:  06:15   AM  +++++Call this number if you have any questions or problems the morning of surgery [phone]  FOLLOW BOWEL PREP AND ANY ADDITIONAL PRE OP INSTRUCTIONS YOU RECEIVED FROM YOUR SURGEON'S OFFICE!!!  Dulcolax 20 mg - Take four '5mg'$  tablets (total 20 mg)with water at 07:00 am the day prior to surgery.  Miralax 255 g - Mix with 64 oz  Gatorade/Powerade.  Starting at 10:00 am ,Drink this gradually over the next few hours (8 oz glass every 15-30 minutes) until gone the day prior to surgery You should finish in 4 hours-6 hours.    Neomycin 1000 mg - At 2 pm, 3 pm and 10 pm after Miralax  bowel prep the day prior to surgery.  Metronidazole 1000 mg - At 2 pm, 3 pm and 10 pm after Miralax bowel prep the day prior to surgery.   Drink plenty of clear liquids all evening to avoid getting dehydrated.    Do not eat food after Midnight the night prior to your surgery/procedure.  After Midnight you may have the following liquids until   05:30  AM / PM DAY OF SURGERY  Clear Liquid Diet Water Black Coffee (sugar ok, NO MILK/CREAM OR CREAMERS)  Tea (sugar ok, NO MILK/CREAM OR CREAMERS) regular and decaf                             Plain Jell-O  with no fruit (NO RED)                                           Fruit ices (not with fruit pulp, NO RED)  Popsicles (NO RED)                                                                  Juice: apple, WHITE grape, WHITE cranberry Sports drinks like Gatorade or Powerade (NO RED)  DRINK two (2) bottles of Pre-Surgery Clear Ensure starting at 6:00 pm the evening prior to your surgery to help prevent dehydration. Increase drinking clear fluids (see list above)                           The day of surgery:  Drink ONE (1) Pre-Surgery Clear Ensure at  05:30   AM the morning of surgery. Drink in one sitting. Do not sip.  This drink was given to you during your hospital pre-op appointment visit. Nothing else to drink after completing the Pre-Surgery Clear Ensure : No candy, chewing gum or throat lozenges.      Oral Hygiene is also important to reduce your risk of infection.        Remember - BRUSH YOUR TEETH THE MORNING OF SURGERY WITH YOUR REGULAR TOOTHPASTE   Take ONLY these medicines the morning of surgery with A SIP OF WATER: Escitalopram  (Lexapro)                   You may not have any metal on your body including hair pins, jewelry, and body piercing  Do not wear make-up, lotions, powders, perfumes or deodorant  Do not wear nail polish including gel and S&S, artificial / acrylic nails, or any other type of covering on natural nails including finger and toenails. If you have artificial nails, gel coating, etc., that needs to be removed by a nail salon, Please have this removed prior to surgery. Not doing so may mean that your surgery could be cancelled or delayed if the Surgeon or anesthesia staff feels like they are unable to monitor you safely.   Do not shave 48 hours prior to surgery to avoid nicks in your skin which may contribute to postoperative infections.   Contacts, Hearing Aids, dentures or bridgework may not be worn into surgery.   You may bring a small overnight bag with you on the day of surgery, only pack items that are not valuable. [facility] IS NOT RESPONSIBLE   FOR VALUABLES THAT ARE LOST OR STOLEN.   Do not bring your home medications to the hospital. The Pharmacy will dispense medications listed on your medication list to you during your admission in the Hospital.  Special Instructions: Bring a copy of your healthcare power of attorney and living will documents the day of surgery, if you wish to have them scanned into your  Medical Records- EPIC  Please read over the following fact sheets you were given: IF YOU HAVE QUESTIONS ABOUT YOUR PRE-OP INSTRUCTIONS, PLEASE CALL [phone]  ([place])   [address] - Preparing for Surgery Before surgery, you can play an important role.  Because skin is not sterile, your skin needs to be as free of germs as possible.  You can reduce the number of germs on your skin by washing with CHG (chlorahexidine gluconate) soap before surgery.  CHG is an antiseptic cleaner which kills germs and bonds with the skin  to continue killing germs even after washing. Please DO  NOT use if you have an allergy to CHG or antibacterial soaps.  If your skin becomes reddened/irritated stop using the CHG and inform your nurse when you arrive at Short Stay. Do not shave (including legs and underarms) for at least 48 hours prior to the first CHG shower.  You may shave your face/neck.  Please follow these instructions carefully:  1.  Shower with CHG Soap the night before surgery and the  morning of surgery.  2.  If you choose to wash your hair, wash your hair first as usual with your normal  shampoo.  3.  After you shampoo, rinse your hair and body thoroughly to remove the shampoo.                             4.  Use CHG as you would any other liquid soap.  You can apply chg directly to the skin and wash.  Gently with a scrungie or clean washcloth.  5.  Apply the CHG Soap to your body ONLY FROM THE NECK DOWN.   Do not use on face/ open                           Wound or open sores. Avoid contact with eyes, ears mouth and genitals (private parts).                       Wash face,  Genitals (private parts) with your normal soap.             6.  Wash thoroughly, paying special attention to the area where your  surgery  will be performed.  7.  Thoroughly rinse your body with warm water from the neck down.  8.  DO NOT shower/wash with your normal soap after using and rinsing off the CHG Soap.            9.  Pat yourself dry with a clean towel.            10.  Wear clean pajamas.            11.  Place clean sheets on your bed the night of your first shower and do not  sleep with pets.  ON THE DAY OF SURGERY : Do not apply any lotions/deodorants the morning of surgery.  Please wear clean clothes to the hospital/surgery center.    FAILURE TO FOLLOW THESE INSTRUCTIONS MAY RESULT IN THE CANCELLATION OF YOUR SURGERY  PATIENT SIGNATURE_________________________________  NURSE  SIGNATURE__________________________________  ________________________________________________________________________      Nancy Walker    An incentive spirometer is a tool that can help keep your lungs clear and active. This tool measures how well you are filling your lungs with each breath. Taking long deep breaths may help reverse or decrease the chance of developing breathing (pulmonary) problems (especially infection) following: A long period of time when you are unable to move or be active. BEFORE THE PROCEDURE  If the spirometer includes an indicator to show your best effort, your nurse or respiratory therapist will set it to a desired goal. If possible, sit up straight or lean slightly forward. Try not to slouch. Hold the incentive spirometer in an upright position. INSTRUCTIONS FOR USE  Sit on the edge of your bed if possible, or sit up as far as you can in  bed or on a chair. Hold the incentive spirometer in an upright position. Breathe out normally. Place the mouthpiece in your mouth and seal your lips tightly around it. Breathe in slowly and as deeply as possible, raising the piston or the ball toward the top of the column. Hold your breath for 3-5 seconds or for as long as possible. Allow the piston or ball to fall to the bottom of the column. Remove the mouthpiece from your mouth and breathe out normally. Rest for a few seconds and repeat Steps 1 through 7 at least 10 times every 1-2 hours when you are awake. Take your time and take a few normal breaths between deep breaths. The spirometer may include an indicator to show your best effort. Use the indicator as a goal to work toward during each repetition. After each set of 10 deep breaths, practice coughing to be sure your lungs are clear. If you have an incision (the cut made at the time of surgery), support your incision when coughing by placing a pillow or rolled up towels firmly against it. Once you are able to  get out of bed, walk around indoors and cough well. You may stop using the incentive spirometer when instructed by your caregiver.  RISKS AND COMPLICATIONS Take your time so you do not get dizzy or light-headed. If you are in pain, you may need to take or ask for pain medication before doing incentive spirometry. It is harder to take a deep breath if you are having pain. AFTER USE Rest and breathe slowly and easily. It can be helpful to keep track of a log of your progress. Your caregiver can provide you with a simple table to help with this. If you are using the spirometer at home, follow these instructions: [place] IF:  You are having difficultly using the spirometer. You have trouble using the spirometer as often as instructed. Your pain medication is not giving enough relief while using the spirometer. You develop fever of 100.5 F (38.1 C) or higher.                                                                                                    SEEK IMMEDIATE MEDICAL CARE IF:  You cough up bloody sputum that had not been present before. You develop fever of 102 F (38.9 C) or greater. You develop worsening pain at or near the incision site. MAKE SURE YOU:  Understand these instructions. Will watch your condition. Will get help right away if you are not doing well or get worse. Document Released: 07/24/2006 Document Revised: 06/05/2011 Document Reviewed: 09/24/2006 [facility] Patient Information 2014 [place], [place].

## 2022-04-26 NOTE — Progress Notes (Signed)
COVID Vaccine received:  []  No [x]  Yes Date of any COVID positive Test in last 90 days:  PCP -  Cari Caraway, MD Cardiologist - Thompson Grayer, MD  [phone] for bradycardia- HR 30s-50s)  Chest x-ray - 07-20-2016  epic EKG - 08-07-2016  epic  Stress Test -  ECHO - 07-23-2016  epic Cardiac Cath -   PCR screen: []  Ordered & Completed                      [x]   No Order but Needs PROFEND                      []   N/A for this surgery  Surgery Plan:  []  Ambulatory                            []  Outpatient in bed                            [x]  Admit  Anesthesia:    [x]  General  []  Spinal                           []   Choice []   MAC  Bowel Prep - []  No  [x]   Yes _Miralax, Dulcolax, etc   3 Ensures  Pacemaker / ICD device [x]  No []  Yes        Device order form faxed [x]  No    []   Yes      Faxed to:  Spinal Cord Stimulator:[x]  No []  Yes      (Remind patient to bring remote DOS) Other Implants:   History of Sleep Apnea? [x]  No []  Yes   CPAP used?- [x]  No []  Yes    Does the patient monitor blood sugar? []  No []  Yes  [x]  N/A     No DM  Blood Thinner / Instructions: none Aspirin Instructions:   none  ERAS Protocol Ordered: []  No  [x]  Yes PRE-SURGERY [x]  ENSURE X3   Patient is to be NPO after: 05:30 am  Comments:   Activity level: Patient can / can not climb a flight of stairs without difficulty; []  No CP  []  No SOB, but would have ______   Patient can / can not perform ADLs without assistance.   Anesthesia review: Bradycardia EP workup in 2018, mild to moderate TR per Echo 2018,  No cardiac followup.   Patient denies shortness of breath, fever, cough and chest pain at PAT appointment.  Patient verbalized understanding and agreement to the Pre-Surgical Instructions that were given to them at this PAT appointment. Patient was also educated of the need to review these PAT instructions again prior to his/her surgery.I reviewed the appropriate phone numbers to call if they have any and  questions or concerns.

## 2022-04-27 ENCOUNTER — Other Ambulatory Visit: Payer: Self-pay

## 2022-04-27 ENCOUNTER — Encounter (HOSPITAL_COMMUNITY)
Admission: RE | Admit: 2022-04-27 | Discharge: 2022-04-27 | Disposition: A | Discharge status: Home / Self Care | Payer: Commercial Managed Care - HMO | Source: Ambulatory Visit | Attending: General Surgery | Admitting: General Surgery

## 2022-04-27 ENCOUNTER — Encounter (HOSPITAL_COMMUNITY): Payer: Self-pay

## 2022-04-27 VITALS — BP 103/62 | HR 45 | Temp 98.7°F | Resp 14 | Ht 67.0 in | Wt 140.0 lb

## 2022-04-27 DIAGNOSIS — I2510 Atherosclerotic heart disease of native coronary artery without angina pectoris: Secondary | ICD-10-CM | POA: Diagnosis not present

## 2022-04-27 DIAGNOSIS — Z01818 Encounter for other preprocedural examination: Secondary | ICD-10-CM | POA: Insufficient documentation

## 2022-04-27 DIAGNOSIS — D122 Benign neoplasm of ascending colon: Secondary | ICD-10-CM | POA: Insufficient documentation

## 2022-04-27 DIAGNOSIS — R001 Bradycardia, unspecified: Secondary | ICD-10-CM

## 2022-04-27 HISTORY — DX: Bradycardia, unspecified: R00.1

## 2022-04-27 LAB — CBC
HCT: 39.7 % (ref 36.0–46.0)
Hemoglobin: 12.6 g/dL (ref 12.0–15.0)
MCH: 28.8 pg (ref 26.0–34.0)
MCHC: 31.7 g/dL (ref 30.0–36.0)
MCV: 90.6 fL (ref 80.0–100.0)
Platelets: 188 10*3/uL (ref 150–400)
RBC: 4.38 MIL/uL (ref 3.87–5.11)
RDW: 12.6 % (ref 11.5–15.5)
WBC: 5.5 10*3/uL (ref 4.0–10.5)
nRBC: 0 % (ref 0.0–0.2)

## 2022-04-27 LAB — BASIC METABOLIC PANEL
Anion gap: 9 (ref 5–15)
BUN: 17 mg/dL (ref 6–20)
CO2: 28 mmol/L (ref 22–32)
Calcium: 9 mg/dL (ref 8.9–10.3)
Chloride: 101 mmol/L (ref 98–111)
Creatinine, Ser: 0.72 mg/dL (ref 0.44–1.00)
GFR, Estimated: >60 mL/min (ref >60)
Glucose, Bld: 96 mg/dL (ref 70–99)
Potassium: 4.1 mmol/L (ref 3.5–5.1)
Sodium: 138 mmol/L (ref 135–145)

## 2022-04-29 LAB — CEA: CEA: 0.8 ng/mL (ref 0.0–4.7)

## 2022-05-09 NOTE — Anesthesia Preprocedure Evaluation (Signed)
Anesthesia Evaluation  Patient identified by MRN, date of birth, ID band Patient awake    Reviewed: Allergy & Precautions, H&P , NPO status , Patient's Chart, lab work & pertinent test results  Airway Mallampati: II  TM Distance: >3 FB Neck ROM: Full    Dental no notable dental hx. (+) Teeth Intact, Dental Advisory Given   Pulmonary neg pulmonary ROS   Pulmonary exam normal breath sounds clear to auscultation       Cardiovascular Exercise Tolerance: Good negative cardio ROS  Rhythm:Regular Rate:Normal     Neuro/Psych   Anxiety Depression    negative neurological ROS     GI/Hepatic negative GI ROS, Neg liver ROS,,,  Endo/Other  negative endocrine ROS    Renal/GU negative Renal ROS  negative genitourinary   Musculoskeletal   Abdominal   Peds  Hematology negative hematology ROS (+)   Anesthesia Other Findings   Reproductive/Obstetrics negative OB ROS                             Anesthesia Physical Anesthesia Plan  ASA: 2  Anesthesia Plan: General   Post-op Pain Management: Ofirmev IV (intra-op)* and Toradol IV (intra-op)*   Induction: Intravenous, Rapid sequence and Cricoid pressure planned  PONV Risk Score and Plan: 4 or greater and Ondansetron, Dexamethasone and Midazolam  Airway Management Planned: Oral ETT  Additional Equipment:   Intra-op Plan:   Post-operative Plan: Extubation in OR  Informed Consent: I have reviewed the patients History and Physical, chart, labs and discussed the procedure including the risks, benefits and alternatives for the proposed anesthesia with the patient or authorized representative who has indicated his/her understanding and acceptance.     Dental advisory given  Plan Discussed with: CRNA  Anesthesia Plan Comments:        Anesthesia Quick Evaluation

## 2022-05-10 ENCOUNTER — Other Ambulatory Visit: Payer: Self-pay

## 2022-05-10 ENCOUNTER — Encounter (HOSPITAL_COMMUNITY)
Admission: RE | Disposition: A | Discharge status: Home / Self Care | Payer: Self-pay | Source: Home / Self Care | Attending: General Surgery

## 2022-05-10 ENCOUNTER — Encounter (HOSPITAL_COMMUNITY): Payer: Self-pay | Admitting: General Surgery

## 2022-05-10 ENCOUNTER — Inpatient Hospital Stay (HOSPITAL_COMMUNITY): Payer: Commercial Managed Care - HMO | Admitting: Anesthesiology

## 2022-05-10 ENCOUNTER — Inpatient Hospital Stay (HOSPITAL_COMMUNITY)
Admission: RE | Admit: 2022-05-10 | Discharge: 2022-05-12 | DRG: 331 | Disposition: A | Discharge status: Home / Self Care | Payer: Commercial Managed Care - HMO | Attending: General Surgery | Admitting: General Surgery

## 2022-05-10 ENCOUNTER — Inpatient Hospital Stay (HOSPITAL_COMMUNITY): Payer: Commercial Managed Care - HMO | Admitting: Physician Assistant

## 2022-05-10 DIAGNOSIS — Z880 Allergy status to penicillin: Secondary | ICD-10-CM

## 2022-05-10 DIAGNOSIS — K6389 Other specified diseases of intestine: Secondary | ICD-10-CM | POA: Diagnosis present

## 2022-05-10 DIAGNOSIS — F32A Depression, unspecified: Secondary | ICD-10-CM | POA: Diagnosis present

## 2022-05-10 DIAGNOSIS — Z79899 Other long term (current) drug therapy: Secondary | ICD-10-CM

## 2022-05-10 DIAGNOSIS — Z808 Family history of malignant neoplasm of other organs or systems: Secondary | ICD-10-CM | POA: Diagnosis not present

## 2022-05-10 DIAGNOSIS — Z91040 Latex allergy status: Secondary | ICD-10-CM

## 2022-05-10 DIAGNOSIS — K635 Polyp of colon: Secondary | ICD-10-CM | POA: Diagnosis not present

## 2022-05-10 DIAGNOSIS — Z7983 Long term (current) use of bisphosphonates: Secondary | ICD-10-CM

## 2022-05-10 DIAGNOSIS — C189 Malignant neoplasm of colon, unspecified: Secondary | ICD-10-CM | POA: Diagnosis present

## 2022-05-10 HISTORY — PX: COLON SURGERY: SHX602

## 2022-05-10 SURGERY — COLECTOMY, PARTIAL, ROBOT-ASSISTED, LAPAROSCOPIC
Anesthesia: General

## 2022-05-10 MED ORDER — BUPIVACAINE LIPOSOME 1.3 % IJ SUSP: 20.0000 mL | Freq: Once | INTRAMUSCULAR | Status: DC

## 2022-05-10 MED ORDER — HYDROMORPHONE HCL 1 MG/ML IJ SOLN
0.2500 mg | INTRAMUSCULAR | Status: DC | PRN
  Administered 2022-05-10: 0.5 mg via INTRAVENOUS

## 2022-05-10 MED ORDER — ORAL CARE MOUTH RINSE: 15.0000 mL | Freq: Once | OROMUCOSAL | Status: AC

## 2022-05-10 MED ORDER — ONDANSETRON HCL 4 MG PO TABS
4.0000 mg | ORAL_TABLET | Freq: Four times a day (QID) | ORAL | Status: DC | PRN
  Administered 2022-05-11: 4 mg via ORAL
  Filled 2022-05-10: qty 1

## 2022-05-10 MED ORDER — DEXAMETHASONE SODIUM PHOSPHATE 10 MG/ML IJ SOLN
INTRAMUSCULAR | Status: DC | PRN
  Administered 2022-05-10: 4 mg via INTRAVENOUS

## 2022-05-10 MED ORDER — ACETAMINOPHEN 500 MG PO TABS
1000.0000 mg | ORAL_TABLET | Freq: Four times a day (QID) | ORAL | Status: DC
  Administered 2022-05-11 (×4): 1000 mg via ORAL
  Filled 2022-05-10 (×6): qty 2

## 2022-05-10 MED ORDER — ENSURE SURGERY PO LIQD: 237.0000 mL | Freq: Two times a day (BID) | ORAL | Status: DC

## 2022-05-10 MED ORDER — ROCURONIUM BROMIDE 10 MG/ML (PF) SYRINGE
PREFILLED_SYRINGE | INTRAVENOUS | Status: AC
  Filled 2022-05-10: qty 10

## 2022-05-10 MED ORDER — ACETAMINOPHEN 10 MG/ML IV SOLN
INTRAVENOUS | Status: DC | PRN
  Administered 2022-05-10: 1000 mg via INTRAVENOUS

## 2022-05-10 MED ORDER — LIDOCAINE HCL (PF) 2 % IJ SOLN
INTRAMUSCULAR | Status: AC
  Filled 2022-05-10: qty 5

## 2022-05-10 MED ORDER — CALCIUM CARBONATE 1250 (500 CA) MG PO TABS
1250.0000 mg | ORAL_TABLET | Freq: Every day | ORAL | Status: DC
  Administered 2022-05-11 – 2022-05-12 (×2): 1250 mg via ORAL
  Filled 2022-05-10 (×2): qty 1

## 2022-05-10 MED ORDER — BUPIVACAINE LIPOSOME 1.3 % IJ SUSP
INTRAMUSCULAR | Status: AC
  Filled 2022-05-10: qty 20

## 2022-05-10 MED ORDER — SUCCINYLCHOLINE CHLORIDE 200 MG/10ML IV SOSY
PREFILLED_SYRINGE | INTRAVENOUS | Status: DC | PRN
  Administered 2022-05-10: 100 mg via INTRAVENOUS

## 2022-05-10 MED ORDER — MIDAZOLAM HCL 2 MG/2ML IJ SOLN
INTRAMUSCULAR | Status: DC | PRN
  Administered 2022-05-10: 2 mg via INTRAVENOUS

## 2022-05-10 MED ORDER — ONDANSETRON HCL 4 MG/2ML IJ SOLN
INTRAMUSCULAR | Status: AC
  Filled 2022-05-10: qty 2

## 2022-05-10 MED ORDER — KETAMINE HCL 10 MG/ML IJ SOLN
INTRAMUSCULAR | Status: DC | PRN
  Administered 2022-05-10 (×2): 15 mg via INTRAVENOUS

## 2022-05-10 MED ORDER — SODIUM CHLORIDE 0.9 % IV SOLN
12.5000 mg | Freq: Four times a day (QID) | INTRAVENOUS | Status: DC | PRN
  Administered 2022-05-10: 12.5 mg via INTRAVENOUS
  Filled 2022-05-10: qty 0.5
  Filled 2022-05-10: qty 12.5

## 2022-05-10 MED ORDER — EPHEDRINE 5 MG/ML INJ
INTRAVENOUS | Status: AC
  Filled 2022-05-10: qty 5

## 2022-05-10 MED ORDER — PROPOFOL 10 MG/ML IV BOLUS
INTRAVENOUS | Status: AC
  Filled 2022-05-10: qty 20

## 2022-05-10 MED ORDER — LACTATED RINGERS IV SOLN: INTRAVENOUS | Status: DC

## 2022-05-10 MED ORDER — POLYETHYLENE GLYCOL 3350 17 GM/SCOOP PO POWD: 1.0000 | Freq: Once | ORAL | Status: DC

## 2022-05-10 MED ORDER — SACCHAROMYCES BOULARDII 250 MG PO CAPS
250.0000 mg | ORAL_CAPSULE | Freq: Two times a day (BID) | ORAL | Status: DC
  Administered 2022-05-10 – 2022-05-12 (×4): 250 mg via ORAL
  Filled 2022-05-10 (×4): qty 1

## 2022-05-10 MED ORDER — SUMATRIPTAN SUCCINATE 6 MG/0.5ML ~~LOC~~ SOLN
6.0000 mg | Freq: Once | SUBCUTANEOUS | Status: AC
  Administered 2022-05-10: 6 mg via SUBCUTANEOUS
  Filled 2022-05-10: qty 0.5

## 2022-05-10 MED ORDER — DEXAMETHASONE SODIUM PHOSPHATE 10 MG/ML IJ SOLN
INTRAMUSCULAR | Status: AC
  Filled 2022-05-10: qty 1

## 2022-05-10 MED ORDER — PHENYLEPHRINE HCL (PRESSORS) 10 MG/ML IV SOLN
INTRAVENOUS | Status: AC
  Filled 2022-05-10: qty 1

## 2022-05-10 MED ORDER — GABAPENTIN 100 MG PO CAPS
300.0000 mg | ORAL_CAPSULE | Freq: Two times a day (BID) | ORAL | Status: DC
  Administered 2022-05-10 – 2022-05-12 (×4): 300 mg via ORAL
  Filled 2022-05-10 (×4): qty 3

## 2022-05-10 MED ORDER — DIPHENHYDRAMINE HCL 12.5 MG/5ML PO ELIX: 12.5000 mg | ORAL_SOLUTION | Freq: Four times a day (QID) | ORAL | Status: DC | PRN

## 2022-05-10 MED ORDER — KETOROLAC TROMETHAMINE 30 MG/ML IJ SOLN
INTRAMUSCULAR | Status: AC
  Filled 2022-05-10: qty 1

## 2022-05-10 MED ORDER — ENSURE PRE-SURGERY PO LIQD: 592.0000 mL | Freq: Once | ORAL | Status: DC

## 2022-05-10 MED ORDER — MIDAZOLAM HCL 2 MG/2ML IJ SOLN
INTRAMUSCULAR | Status: AC
  Filled 2022-05-10: qty 2

## 2022-05-10 MED ORDER — ONDANSETRON HCL 4 MG/2ML IJ SOLN
4.0000 mg | Freq: Four times a day (QID) | INTRAMUSCULAR | Status: DC | PRN
  Administered 2022-05-10 – 2022-05-11 (×3): 4 mg via INTRAVENOUS
  Filled 2022-05-10 (×3): qty 2

## 2022-05-10 MED ORDER — BUPIVACAINE LIPOSOME 1.3 % IJ SUSP
INTRAMUSCULAR | Status: DC | PRN
  Administered 2022-05-10: 50 mL

## 2022-05-10 MED ORDER — ENSURE PRE-SURGERY PO LIQD: 296.0000 mL | Freq: Once | ORAL | Status: DC

## 2022-05-10 MED ORDER — PROPOFOL 10 MG/ML IV BOLUS
INTRAVENOUS | Status: DC | PRN
  Administered 2022-05-10: 130 mg via INTRAVENOUS

## 2022-05-10 MED ORDER — EPHEDRINE SULFATE-NACL 50-0.9 MG/10ML-% IV SOSY
PREFILLED_SYRINGE | INTRAVENOUS | Status: DC | PRN
  Administered 2022-05-10 (×2): 10 mg via INTRAVENOUS

## 2022-05-10 MED ORDER — SUGAMMADEX SODIUM 200 MG/2ML IV SOLN
INTRAVENOUS | Status: DC | PRN
  Administered 2022-05-10: 190 mg via INTRAVENOUS

## 2022-05-10 MED ORDER — ALVIMOPAN 12 MG PO CAPS
12.0000 mg | ORAL_CAPSULE | Freq: Two times a day (BID) | ORAL | Status: DC
  Administered 2022-05-11 (×2): 12 mg via ORAL
  Filled 2022-05-10 (×2): qty 1

## 2022-05-10 MED ORDER — KCL IN DEXTROSE-NACL 20-5-0.45 MEQ/L-%-% IV SOLN
INTRAVENOUS | Status: AC
  Filled 2022-05-10 (×3): qty 1000

## 2022-05-10 MED ORDER — PHENYLEPHRINE 80 MCG/ML (10ML) SYRINGE FOR IV PUSH (FOR BLOOD PRESSURE SUPPORT)
PREFILLED_SYRINGE | INTRAVENOUS | Status: DC | PRN
  Administered 2022-05-10 (×3): 80 ug via INTRAVENOUS

## 2022-05-10 MED ORDER — ACETAMINOPHEN 10 MG/ML IV SOLN
INTRAVENOUS | Status: AC
  Filled 2022-05-10: qty 100

## 2022-05-10 MED ORDER — DROPERIDOL 2.5 MG/ML IJ SOLN
INTRAMUSCULAR | Status: DC | PRN
  Administered 2022-05-10: .625 mg via INTRAVENOUS

## 2022-05-10 MED ORDER — DROPERIDOL 2.5 MG/ML IJ SOLN
INTRAMUSCULAR | Status: AC
  Filled 2022-05-10: qty 2

## 2022-05-10 MED ORDER — ONDANSETRON HCL 4 MG/2ML IJ SOLN
4.0000 mg | Freq: Once | INTRAMUSCULAR | Status: AC
  Administered 2022-05-10: 4 mg via INTRAVENOUS
  Filled 2022-05-10: qty 2

## 2022-05-10 MED ORDER — ROCURONIUM BROMIDE 10 MG/ML (PF) SYRINGE
PREFILLED_SYRINGE | INTRAVENOUS | Status: DC | PRN
  Administered 2022-05-10: 50 mg via INTRAVENOUS
  Administered 2022-05-10: 30 mg via INTRAVENOUS

## 2022-05-10 MED ORDER — HYDROMORPHONE HCL 1 MG/ML IJ SOLN
0.5000 mg | INTRAMUSCULAR | Status: DC | PRN
  Administered 2022-05-11: 0.5 mg via INTRAVENOUS
  Filled 2022-05-10: qty 0.5

## 2022-05-10 MED ORDER — CHLORHEXIDINE GLUCONATE 0.12 % MT SOLN
15.0000 mL | Freq: Once | OROMUCOSAL | Status: AC
  Administered 2022-05-10: 15 mL via OROMUCOSAL

## 2022-05-10 MED ORDER — KETAMINE HCL 10 MG/ML IJ SOLN
INTRAMUSCULAR | Status: AC
  Filled 2022-05-10: qty 1

## 2022-05-10 MED ORDER — GABAPENTIN 300 MG PO CAPS
300.0000 mg | ORAL_CAPSULE | ORAL | Status: DC
  Filled 2022-05-10: qty 1

## 2022-05-10 MED ORDER — DIPHENHYDRAMINE HCL 50 MG/ML IJ SOLN: 12.5000 mg | Freq: Four times a day (QID) | INTRAMUSCULAR | Status: DC | PRN

## 2022-05-10 MED ORDER — ALUM & MAG HYDROXIDE-SIMETH 200-200-20 MG/5ML PO SUSP: 30.0000 mL | Freq: Four times a day (QID) | ORAL | Status: DC | PRN

## 2022-05-10 MED ORDER — PHENYLEPHRINE HCL-NACL 20-0.9 MG/250ML-% IV SOLN
INTRAVENOUS | Status: DC | PRN
  Administered 2022-05-10: 25 ug/min via INTRAVENOUS

## 2022-05-10 MED ORDER — HYDROMORPHONE HCL 1 MG/ML IJ SOLN
INTRAMUSCULAR | Status: AC
  Administered 2022-05-10: 0.5 mg via INTRAVENOUS
  Filled 2022-05-10: qty 1

## 2022-05-10 MED ORDER — SODIUM CHLORIDE 0.9 % IV SOLN
2.0000 g | INTRAVENOUS | Status: AC
  Administered 2022-05-10: 2 g via INTRAVENOUS
  Filled 2022-05-10: qty 2

## 2022-05-10 MED ORDER — PHENYLEPHRINE 80 MCG/ML (10ML) SYRINGE FOR IV PUSH (FOR BLOOD PRESSURE SUPPORT)
PREFILLED_SYRINGE | INTRAVENOUS | Status: AC
  Filled 2022-05-10: qty 10

## 2022-05-10 MED ORDER — BUPIVACAINE HCL (PF) 0.25 % IJ SOLN
INTRAMUSCULAR | Status: AC
  Filled 2022-05-10: qty 30

## 2022-05-10 MED ORDER — FENTANYL CITRATE (PF) 100 MCG/2ML IJ SOLN
INTRAMUSCULAR | Status: AC
  Filled 2022-05-10: qty 2

## 2022-05-10 MED ORDER — LIDOCAINE 2% (20 MG/ML) 5 ML SYRINGE
INTRAMUSCULAR | Status: DC | PRN
  Administered 2022-05-10: 60 mg via INTRAVENOUS

## 2022-05-10 MED ORDER — SUMATRIPTAN SUCCINATE 50 MG PO TABS
50.0000 mg | ORAL_TABLET | ORAL | Status: DC | PRN
  Filled 2022-05-10: qty 1

## 2022-05-10 MED ORDER — 0.9 % SODIUM CHLORIDE (POUR BTL) OPTIME
TOPICAL | Status: DC | PRN
  Administered 2022-05-10: 2000 mL

## 2022-05-10 MED ORDER — SIMETHICONE 80 MG PO CHEW: 40.0000 mg | CHEWABLE_TABLET | Freq: Four times a day (QID) | ORAL | Status: DC | PRN

## 2022-05-10 MED ORDER — LACTATED RINGERS IV SOLN: INTRAVENOUS | Status: DC | PRN

## 2022-05-10 MED ORDER — ENOXAPARIN SODIUM 40 MG/0.4ML IJ SOSY
40.0000 mg | PREFILLED_SYRINGE | INTRAMUSCULAR | Status: DC
  Administered 2022-05-11 – 2022-05-12 (×2): 40 mg via SUBCUTANEOUS
  Filled 2022-05-10 (×2): qty 0.4

## 2022-05-10 MED ORDER — FENTANYL CITRATE (PF) 100 MCG/2ML IJ SOLN
INTRAMUSCULAR | Status: DC | PRN
  Administered 2022-05-10: 100 ug via INTRAVENOUS
  Administered 2022-05-10: 50 ug via INTRAVENOUS

## 2022-05-10 MED ORDER — ESCITALOPRAM OXALATE 20 MG PO TABS
10.0000 mg | ORAL_TABLET | Freq: Every day | ORAL | Status: DC
  Administered 2022-05-10 – 2022-05-12 (×3): 10 mg via ORAL
  Filled 2022-05-10 (×3): qty 1

## 2022-05-10 MED ORDER — BISACODYL 5 MG PO TBEC: 20.0000 mg | DELAYED_RELEASE_TABLET | Freq: Once | ORAL | Status: DC

## 2022-05-10 MED ORDER — ACETAMINOPHEN 500 MG PO TABS
1000.0000 mg | ORAL_TABLET | ORAL | Status: DC
  Filled 2022-05-10: qty 2

## 2022-05-10 MED ORDER — LACTATED RINGERS IR SOLN
Status: DC | PRN
  Administered 2022-05-10: 1000 mL

## 2022-05-10 MED ORDER — ALVIMOPAN 12 MG PO CAPS
12.0000 mg | ORAL_CAPSULE | ORAL | Status: DC
  Filled 2022-05-10: qty 1

## 2022-05-10 MED ORDER — SUCCINYLCHOLINE CHLORIDE 200 MG/10ML IV SOSY
PREFILLED_SYRINGE | INTRAVENOUS | Status: AC
  Filled 2022-05-10: qty 10

## 2022-05-10 SURGICAL SUPPLY — 103 items
ADH SKN CLS APL DERMABOND .7 (GAUZE/BANDAGES/DRESSINGS) ×2
BAG COUNTER SPONGE SURGICOUNT (BAG) ×1 IMPLANT
BAG SPNG CNTER NS LX DISP (BAG) ×1
BLADE EXTENDED COATED 6.5IN (ELECTRODE) IMPLANT
CANNULA REDUC XI 12-8 STAPL (CANNULA)
CANNULA REDUCER 12-8 DVNC XI (CANNULA) IMPLANT
CELLS DAT CNTRL 66122 CELL SVR (MISCELLANEOUS) IMPLANT
COVER SURGICAL LIGHT HANDLE (MISCELLANEOUS) ×2 IMPLANT
COVER TIP SHEARS 8 DVNC (MISCELLANEOUS) ×1 IMPLANT
COVER TIP SHEARS 8MM DA VINCI (MISCELLANEOUS) ×1
DERMABOND ADVANCED .7 DNX12 (GAUZE/BANDAGES/DRESSINGS) IMPLANT
DRAIN CHANNEL 19F RND (DRAIN) IMPLANT
DRAPE ARM DVNC X/XI (DISPOSABLE) ×4 IMPLANT
DRAPE COLUMN DVNC XI (DISPOSABLE) ×1 IMPLANT
DRAPE DA VINCI XI ARM (DISPOSABLE) ×4
DRAPE DA VINCI XI COLUMN (DISPOSABLE) ×1
DRAPE SURG IRRIG POUCH 19X23 (DRAPES) ×1 IMPLANT
DRSG OPSITE POSTOP 4X10 (GAUZE/BANDAGES/DRESSINGS) IMPLANT
DRSG OPSITE POSTOP 4X6 (GAUZE/BANDAGES/DRESSINGS) IMPLANT
DRSG OPSITE POSTOP 4X8 (GAUZE/BANDAGES/DRESSINGS) IMPLANT
ELECT PENCIL ROCKER SW 15FT (MISCELLANEOUS) ×1 IMPLANT
ELECT REM PT RETURN 15FT ADLT (MISCELLANEOUS) ×1 IMPLANT
ENDOLOOP SUT PDS II  0 18 (SUTURE)
ENDOLOOP SUT PDS II 0 18 (SUTURE) IMPLANT
EVACUATOR SILICONE 100CC (DRAIN) IMPLANT
GLOVE BIO SURGEON STRL SZ 6.5 (GLOVE) ×3 IMPLANT
GLOVE BIOGEL PI IND STRL 7.0 (GLOVE) ×2 IMPLANT
GLOVE INDICATOR 6.5 STRL GRN (GLOVE) ×1 IMPLANT
GOWN SRG XL LVL 4 BRTHBL STRL (GOWNS) ×1 IMPLANT
GOWN STRL NON-REIN XL LVL4 (GOWNS) ×1
GOWN STRL REUS W/ TWL XL LVL3 (GOWN DISPOSABLE) ×3 IMPLANT
GOWN STRL REUS W/TWL XL LVL3 (GOWN DISPOSABLE) ×3
GRASPER SUT TROCAR 14GX15 (MISCELLANEOUS) IMPLANT
HOLDER FOLEY CATH W/STRAP (MISCELLANEOUS) ×1 IMPLANT
IRRIG SUCT STRYKERFLOW 2 WTIP (MISCELLANEOUS) ×1
IRRIGATION SUCT STRKRFLW 2 WTP (MISCELLANEOUS) ×1 IMPLANT
KIT PROCEDURE DA VINCI SI (MISCELLANEOUS)
KIT PROCEDURE DVNC SI (MISCELLANEOUS) IMPLANT
KIT TURNOVER KIT A (KITS) IMPLANT
NDL INSUFFLATION 14GA 120MM (NEEDLE) ×1 IMPLANT
NEEDLE INSUFFLATION 14GA 120MM (NEEDLE) ×1 IMPLANT
PACK CARDIOVASCULAR III (CUSTOM PROCEDURE TRAY) ×1 IMPLANT
PACK COLON (CUSTOM PROCEDURE TRAY) ×1 IMPLANT
PAD POSITIONING PINK XL (MISCELLANEOUS) ×1 IMPLANT
RELOAD STAPLE 60 2.5 WHT DVNC (STAPLE) IMPLANT
RELOAD STAPLE 60 3.5 BLU DVNC (STAPLE) IMPLANT
RELOAD STAPLE 60 4.3 GRN DVNC (STAPLE) IMPLANT
RELOAD STAPLER 2.5X60 WHT DVNC (STAPLE) ×1 IMPLANT
RELOAD STAPLER 3.5X60 BLU DVNC (STAPLE) ×1 IMPLANT
RELOAD STAPLER 4.3X60 GRN DVNC (STAPLE) ×1 IMPLANT
RETRACTOR WND ALEXIS 18 MED (MISCELLANEOUS) IMPLANT
RTRCTR WOUND ALEXIS 18CM MED (MISCELLANEOUS)
SCISSORS LAP 5X35 DISP (ENDOMECHANICALS) IMPLANT
SEAL CANN UNIV 5-8 DVNC XI (MISCELLANEOUS) ×3 IMPLANT
SEAL XI 5MM-8MM UNIVERSAL (MISCELLANEOUS) ×3
SEALER VESSEL DA VINCI XI (MISCELLANEOUS) ×1
SEALER VESSEL EXT DVNC XI (MISCELLANEOUS) ×1 IMPLANT
SOL ELECTROSURG ANTI STICK (MISCELLANEOUS) ×1
SOLUTION ELECTROSURG ANTI STCK (MISCELLANEOUS) ×1 IMPLANT
SPIKE FLUID TRANSFER (MISCELLANEOUS) IMPLANT
STAPLER 60 DA VINCI SURE FORM (STAPLE) ×1
STAPLER 60 SUREFORM DVNC (STAPLE) IMPLANT
STAPLER CANNULA SEAL DVNC XI (STAPLE) IMPLANT
STAPLER CANNULA SEAL XI (STAPLE) ×1
STAPLER ECHELON POWER CIR 29 (STAPLE) IMPLANT
STAPLER ECHELON POWER CIR 31 (STAPLE) IMPLANT
STAPLER RELOAD 2.5X60 WHITE (STAPLE) ×1
STAPLER RELOAD 2.5X60 WHT DVNC (STAPLE) ×1
STAPLER RELOAD 3.5X60 BLU DVNC (STAPLE) ×1
STAPLER RELOAD 3.5X60 BLUE (STAPLE) ×1
STAPLER RELOAD 4.3X60 GREEN (STAPLE) ×1
STAPLER RELOAD 4.3X60 GRN DVNC (STAPLE) ×1
STOPCOCK 4 WAY LG BORE MALE ST (IV SETS) ×2 IMPLANT
SUT ETHILON 2 0 PS N (SUTURE) IMPLANT
SUT NOVA NAB GS-21 1 T12 (SUTURE) ×2 IMPLANT
SUT PROLENE 2 0 KS (SUTURE) IMPLANT
SUT SILK 2 0 (SUTURE) ×1
SUT SILK 2 0 SH CR/8 (SUTURE) IMPLANT
SUT SILK 2-0 18XBRD TIE 12 (SUTURE) ×1 IMPLANT
SUT SILK 3 0 (SUTURE)
SUT SILK 3 0 SH CR/8 (SUTURE) ×1 IMPLANT
SUT SILK 3-0 18XBRD TIE 12 (SUTURE) IMPLANT
SUT V-LOC BARB 180 2/0GR6 GS22 (SUTURE) ×2
SUT VIC AB 2-0 SH 18 (SUTURE) IMPLANT
SUT VIC AB 2-0 SH 27 (SUTURE) ×1
SUT VIC AB 2-0 SH 27X BRD (SUTURE) IMPLANT
SUT VIC AB 3-0 SH 18 (SUTURE) IMPLANT
SUT VIC AB 4-0 PS2 27 (SUTURE) ×2 IMPLANT
SUT VICRYL 0 UR6 27IN ABS (SUTURE) ×1 IMPLANT
SUTURE V-LC BRB 180 2/0GR6GS22 (SUTURE) IMPLANT
SYR 20ML ECCENTRIC (SYRINGE) ×1 IMPLANT
SYS LAPSCP GELPORT 120MM (MISCELLANEOUS)
SYS WOUND ALEXIS 18CM MED (MISCELLANEOUS)
SYSTEM LAPSCP GELPORT 120MM (MISCELLANEOUS) IMPLANT
SYSTEM WOUND ALEXIS 18CM MED (MISCELLANEOUS) IMPLANT
TOWEL OR 17X26 10 PK STRL BLUE (TOWEL DISPOSABLE) IMPLANT
TOWEL OR NON WOVEN STRL DISP B (DISPOSABLE) ×1 IMPLANT
TRAY FOL W/BAG SLVR 16FR STRL (SET/KITS/TRAYS/PACK) IMPLANT
TRAY FOLEY MTR SLVR 16FR STAT (SET/KITS/TRAYS/PACK) ×1 IMPLANT
TRAY FOLEY W/BAG SLVR 16FR LF (SET/KITS/TRAYS/PACK) ×1
TROCAR ADV FIXATION 5X100MM (TROCAR) ×1 IMPLANT
TUBING CONNECTING 10 (TUBING) ×2 IMPLANT
TUBING INSUFFLATION 10FT LAP (TUBING) ×1 IMPLANT

## 2022-05-10 NOTE — Anesthesia Procedure Notes (Signed)
Procedure Name: Intubation Date/Time: 05/10/2022 8:28 AM  Performed by: Niel Hummer, CRNAPre-anesthesia Checklist: Patient identified, Emergency Drugs available, Suction available and Patient being monitored Patient Re-evaluated:Patient Re-evaluated prior to induction Oxygen Delivery Method: Circle system utilized Preoxygenation: Pre-oxygenation with 100% oxygen Induction Type: IV induction, Rapid sequence and Cricoid Pressure applied Laryngoscope Size: Mac and 4 Grade View: Grade II Tube type: Oral Tube size: 7.0 mm Number of attempts: 1 Airway Equipment and Method: Stylet Placement Confirmation: ETT inserted through vocal cords under direct vision, positive ETCO2 and breath sounds checked- equal and bilateral Secured at: 22 cm Tube secured with: Tape Dental Injury: Teeth and Oropharynx as per pre-operative assessment

## 2022-05-10 NOTE — H&P (Signed)
REFERRING PHYSICIAN:  Dr Therisa Doyne   PROVIDER:  Monico Blitz, MD   MRN: [id number] DOB: February 21, 1966   Subjective    Chief Complaint: New Consultation       History of Present Illness: Nancy Walker is a 57 y.o. female who is seen today as an office consultation at the request of Dr. Therisa Doyne for evaluation of New Consultation. .   57 year old female who recently underwent a colonoscopy due to a history of constipation.  She was noted to have a 3 cm mass at her appendiceal orifice.  This was biopsied and showed tubulovillous adenoma with high-grade dysplasia.  Patient denies any surgical history except for C-section.     Review of Systems: A complete review of systems was obtained from the patient.  I have reviewed this information and discussed as appropriate with the patient.  See HPI as well for other ROS.       Medical History: History reviewed. No pertinent past medical history.   There is no problem list on file for this patient.          Past Surgical History:  Procedure Laterality Date   REPEAT CESAREAN SECTION               Allergies  Allergen Reactions   Latex Rash      unknown   Penicillins Rash and Other (See Comments)            Current Outpatient Medications on File Prior to Visit  Medication Sig Dispense Refill   alendronate (FOSAMAX) 70 MG tablet Take 1 tablet by mouth every 7 (seven) days       escitalopram oxalate (LEXAPRO) 10 MG tablet Take 10 mg by mouth once daily        No current facility-administered medications on file prior to visit.           Family History  Problem Relation Age of Onset   Skin cancer Father        Social History       Tobacco Use  Smoking Status Never  Smokeless Tobacco Never      Social History         Socioeconomic History   Marital status: Married  Tobacco Use   Smoking status: Never   Smokeless tobacco: Never  Substance and Sexual Activity   Alcohol use: Yes      Comment: 1-2   Drug use:  Never      Objective:      Vitals:   05/10/22 0632  BP: 110/65  Pulse: (!) 48  Resp: 15  Temp: 98 F (36.7 C)  SpO2: 100%      Exam Gen: NAD CV: RRR Lungs: CTA Abd: soft       Labs, Imaging and Diagnostic Testing: Colonoscopy and pathology reports reviewed    Assessment and Plan:  Diagnoses and all orders for this visit:   Adenomatous polyp of ascending colon     57 year old female with a new diagnosis of cecal polyp with tubulovillous adenoma and high-grade dysplasia.  We discussed that in polyps larger than 2 cm is a 30% risk of an invasive cancer.  I recommended proceeding with a robotic assisted right colectomy.  We discussed this in detail including timing of surgery and recovery time.  Patient has a tight employment situation and is worried about being off of work.  We will have her follow-up with the surgery schedulers today to determine a date for surgery.   The surgery  and anatomy were described to the patient as well as the risks of surgery and the possible complications.  These include: Bleeding, deep abdominal infections and possible wound complications such as hernia and infection, damage to adjacent structures, leak of surgical connections, which can lead to other surgeries and possibly an ostomy, possible need for other procedures, such as abscess drains in radiology, possible prolonged hospital stay, possible diarrhea from removal of part of the colon, possible constipation from narcotics, possible bowel, bladder or sexual dysfunction if having rectal surgery, prolonged fatigue/weakness or appetite loss, possible early recurrence of of disease, possible complications of their medical problems such as heart disease or arrhythmias or lung problems, death (less than 1%). I believe the patient understands and wishes to proceed with the surgery.        Rosario Adie, MD Colon and Rectal Surgery [facility]

## 2022-05-10 NOTE — Anesthesia Postprocedure Evaluation (Signed)
Anesthesia Post Note  Patient: Nancy Walker  Procedure(s) Performed: XI ROBOT ASSISTED LAPAROSCOPIC RIGHT COLECTOMY     Patient location during evaluation: PACU Anesthesia Type: General Level of consciousness: awake and alert Pain management: pain level controlled Vital Signs Assessment: post-procedure vital signs reviewed and stable Respiratory status: spontaneous breathing, nonlabored ventilation, respiratory function stable and patient connected to nasal cannula oxygen Cardiovascular status: blood pressure returned to baseline and stable Postop Assessment: no apparent nausea or vomiting Anesthetic complications: no  No notable events documented.  Last Vitals:  Vitals:   05/10/22 1145 05/10/22 1200  BP: (!) 112/57 112/64  Pulse: 68 (!) 58  Resp: 16 17  Temp: (!) 36.4 C   SpO2: 100% 100%    Last Pain:  Vitals:   05/10/22 1200  TempSrc:   PainSc: 0-No pain                 Maddoxx Burkitt,W. EDMOND

## 2022-05-10 NOTE — Op Note (Signed)
05/10/2022  10:49 AM  PATIENT:  Nancy Walker  57 y.o. female  Patient Care Team: Cari Caraway, MD as PCP - General (Family Medicine)  PRE-OPERATIVE DIAGNOSIS:  COLON POLYP  POST-OPERATIVE DIAGNOSIS:  COLON POLYP  PROCEDURE: XI ROBOT ASSISTED RIGHT COLECTOMY    Surgeon(s): Leighton Ruff, MD Ileana Roup, MD  ASSISTANT: Dr Dema Severin   ANESTHESIA:   local and general  EBL: 81m Total I/O In: 1400 [I.V.:1200; IV Piggyback:200] Out: 125 [Urine:100; Blood:25]  Delay start of Pharmacological VTE agent (>24hrs) due to surgical blood loss or risk of bleeding:  no  DRAINS: none   SPECIMEN:  Source of Specimen:  R colon  DISPOSITION OF SPECIMEN:  PATHOLOGY  COUNTS:  YES  PLAN OF CARE: Admit to inpatient   PATIENT DISPOSITION:  PACU - hemodynamically stable.  INDICATION:    57y.o. 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  I recommended segmental resection:  The anatomy & physiology of the digestive tract was discussed.  The pathophysiology was discussed.  Natural history risks without surgery was discussed.   I worked to give an overview of the disease and the frequent need to have multispecialty involvement.  I feel the risks of no intervention will lead to serious problems that outweigh the operative risks; therefore, I recommended a partial colectomy to remove the pathology.  Laparoscopic & open techniques were discussed.   Risks such as bleeding, infection, abscess, leak, reoperation, possible ostomy, hernia, heart attack, death, and other risks were discussed.  I noted a good likelihood this will help address the problem.   Goals of post-operative recovery were discussed as well.    The patient expressed understanding & wished to proceed with surgery.  OR FINDINGS:   Patient had mass at appendiceal orifice  No obvious metastatic disease on visceral parietal peritoneum or liver.  DESCRIPTION:   Informed consent was confirmed.  The  patient underwent general anaesthesia without difficulty.  The patient was positioned appropriately.  VTE prevention in place.  The patient's abdomen was clipped, prepped, & draped in a sterile fashion.  Surgical timeout confirmed our plan.  The patient was positioned in reverse Trendelenburg.  Abdominal entry was gained using a Varies needle in the LUQ.  Entry was clean.  I induced carbon dioxide insufflation.  An 873mrobotic port was placed in the LUQ.  Camera inspection revealed no injury.  Extra ports were carefully placed under direct laparoscopic visualization.  I laparoscopically reflected the greater omentum and the upper abdomen the small bowel in the peilvis. The patient was appropriately positioned and the robot was docked to the patient's right side.  Instruments were placed under direct visualization.    I began by identifying the ileocolic artery and vein within the mesentery. Dissection was bluntly carried around these structures. The duodenum was identified and free from the structures. I then separated the structures bluntly and used the robotic vessel sealer device to transect these.  I developed the retroperitoneal plane bluntly.  I then freed the appendix off its attachments to the pelvic wall. I mobilized the terminal ileum.  I took care to avoid injuring any retroperitoneal structures.  After this I began to mobilize laterally down the white line of [place] and then took down the hepatic flexure using the robotic vessel sealer device. I mobilized the omentum off of the right transverse colon. The entire colon was then flipped medially and mobilized off of the retroperitoneal structures until I could visualize the  lateral edge of the duodenum underneath.  I gently freed the duodenal attachments.   I identified a portion of mesentery of the transverse colon just proximal to the right branch of the middle colic.  I divided up to the colon from the previous dissection of the mesentery using the  robotic vessel sealer.  I then divided the terminal ileal mesentery in similar fashion.  At that point, the terminal ileum was divided with a blue load robotic 60 mm stapler.  The transverse colon was divided with a green load robotic stapler.  The specimen was then completely free and placed in the right upper quadrant.  Hemostasis was good.  I then oriented the remaining terminal ileum and transverse colon and a isoperistaltic fashion.  I placed an enterotomy in the small bowel and colon using the robotic scissors.  I then introduced a white load 60 mm robotic stapler into both enterotomies and created an anastomosis between the small bowel and transverse colon.  Hemostasis within the internal staple line was good.  The common enterotomy channel was closed using 2 running 2-0 V-Loc sutures.  The abdomen was then irrigated with normal saline. The omentum was then brought down over the anastomosis.  At this point the robot was undocked.  The 12 mm suprapubic port was enlarged to a Pfannenstiel incision and an [place] wound protector was placed.  The specimen was removed from the abdomen and evaluated.  Once the abdomen was inspected for hemostasis, the [place] wound protector was removed.    The peritoneum of the Pfannenstiel incision was closed using a running 0 Vicryl suture.  The fascia was then closed using 2 running #1 Novafil sutures.  The subcutaneous tissue of the extraction incision was closed using a running 2-0 Vicryl suture. The skin was then closed using running subcuticular 4-0 Vicryl sutures.  A sterile dressing was applied.  The remaining port sites were closed using interrupted 4-0 Vicryl sutures and Dermabond. All counts were correct per operating room staff. The patient was then awakened from anesthesia and sent to the post anesthesia care unit in stable condition.    Rosario Adie, MD  [facility]

## 2022-05-10 NOTE — Progress Notes (Signed)
0728 Dr. Ola Spurr aware that the patient is unable to take the pre op medications and "stated" it is okay."

## 2022-05-10 NOTE — Transfer of Care (Signed)
Immediate Anesthesia Transfer of Care Note  Patient: Nancy Walker  Procedure(s) Performed: XI ROBOT ASSISTED LAPAROSCOPIC RIGHT COLECTOMY  Patient Location: PACU  Anesthesia Type:General  Level of Consciousness: awake, alert , and oriented  Airway & Oxygen Therapy: Patient Spontanous Breathing and Patient connected to face mask oxygen  Post-op Assessment: Report given to RN, Post -op Vital signs reviewed and stable, and Patient moving all extremities X 4  Post vital signs: Reviewed and stable  Last Vitals:  Vitals Value Taken Time  BP 115/45   Temp    Pulse 62   Resp 12   SpO2 100     Last Pain:  Vitals:   05/10/22 0704  TempSrc:   PainSc: 7          Complications: No notable events documented.

## 2022-05-10 NOTE — Progress Notes (Signed)
0715 Check for CRNA's no one available 0712 Call place to Dr. Ola Spurr at [phone] unable to reach. 0725 Call place to Dr,. Fitzgerald's cell phone, message left patient of Dr. Leighton Ruff, unable to take her pre op medications due to nausea and small of vomiting since 0400.  Asked for a plan if he wanted to consider something possibly for nausea. Asked to call Teshaun Olarte back at [phone].

## 2022-05-10 NOTE — Progress Notes (Signed)
0727 Dr. Ola Spurr called back Zofran 4 mg  IV ordered and given.

## 2022-05-11 LAB — CBC
HCT: 34.5 % — ABNORMAL LOW (ref 36.0–46.0)
Hemoglobin: 11 g/dL — ABNORMAL LOW (ref 12.0–15.0)
MCH: 28.9 pg (ref 26.0–34.0)
MCHC: 31.9 g/dL (ref 30.0–36.0)
MCV: 90.8 fL (ref 80.0–100.0)
Platelets: 169 10*3/uL (ref 150–400)
RBC: 3.8 MIL/uL — ABNORMAL LOW (ref 3.87–5.11)
RDW: 12.9 % (ref 11.5–15.5)
WBC: 8.8 10*3/uL (ref 4.0–10.5)
nRBC: 0 % (ref 0.0–0.2)

## 2022-05-11 LAB — BASIC METABOLIC PANEL
Anion gap: 9 (ref 5–15)
BUN: 6 mg/dL (ref 6–20)
CO2: 23 mmol/L (ref 22–32)
Calcium: 7.9 mg/dL — ABNORMAL LOW (ref 8.9–10.3)
Chloride: 105 mmol/L (ref 98–111)
Creatinine, Ser: 0.58 mg/dL (ref 0.44–1.00)
GFR, Estimated: >60 mL/min (ref >60)
Glucose, Bld: 142 mg/dL — ABNORMAL HIGH (ref 70–99)
Potassium: 3.5 mmol/L (ref 3.5–5.1)
Sodium: 137 mmol/L (ref 135–145)

## 2022-05-11 MED ORDER — TRAMADOL HCL 50 MG PO TABS: 50.0000 mg | ORAL_TABLET | Freq: Four times a day (QID) | ORAL | Status: DC | PRN

## 2022-05-11 NOTE — Progress Notes (Signed)
1 Day Post-Op  Subjective: Had some PO nausea, better this am.  Min pain  Objective: Vital signs in last 24 hours: Temp:  [96.8 F (36 C)-99.2 F (37.3 C)] 98.9 F (37.2 C) (02/15 0458) Pulse Rate:  [58-72] 60 (02/15 0458) Resp:  [14-20] 18 (02/15 0458) BP: (81-115)/(43-64) 96/45 (02/15 0458) SpO2:  [98 %-100 %] 100 % (02/15 0458) Weight:  [65.2 kg] 65.2 kg (02/15 0500)   Intake/Output from previous day: 02/14 0701 - 02/15 0700 In: 3952.9 [P.O.:210; I.V.:3492.9; IV Piggyback:250] Out: [id number] [Urine:2400; Blood:25] Intake/Output this shift: No intake/output data recorded.   General appearance: alert and cooperative GI: soft, non-distended  Incision: no significant drainage  Lab Results:  Recent Labs    05/11/22 0416  WBC 8.8  HGB 11.0*  HCT 34.5*  PLT 169   BMET Recent Labs    05/11/22 0416  NA 137  K 3.5  CL 105  CO2 23  GLUCOSE 142*  BUN 6  CREATININE 0.58  CALCIUM 7.9*   PT/INR No results for input(s): "LABPROT", "INR" in the last 72 hours. ABG No results for input(s): "PHART", "HCO3" in the last 72 hours.  Invalid input(s): "PCO2", "PO2"  MEDS, Scheduled  acetaminophen  1,000 mg Oral Q6H   alvimopan  12 mg Oral BID   calcium carbonate  1,250 mg Oral Daily   enoxaparin (LOVENOX) injection  40 mg Subcutaneous Q24H   escitalopram  10 mg Oral Daily   feeding supplement  237 mL Oral BID BM   gabapentin  300 mg Oral BID   saccharomyces boulardii  250 mg Oral BID    Studies/Results: No results found.  Assessment: s/p Procedure(s): XI ROBOT ASSISTED LAPAROSCOPIC RIGHT COLECTOMY Patient Active Problem List   Diagnosis Date Noted   Colonic mass 05/10/2022   Bradycardia    Frontal lobe contusion ([place]) 07/20/2016    PONV  Plan: Advance diet as tolerated SL IVF's once tolerating clears Ambulate   LOS: 1 day     .Rosario Adie, [facility], [place]    05/11/2022 7:34 AM

## 2022-05-12 LAB — BASIC METABOLIC PANEL
Anion gap: 8 (ref 5–15)
BUN: <5 mg/dL — ABNORMAL LOW (ref 6–20)
CO2: 26 mmol/L (ref 22–32)
Calcium: 8.1 mg/dL — ABNORMAL LOW (ref 8.9–10.3)
Chloride: 106 mmol/L (ref 98–111)
Creatinine, Ser: 0.62 mg/dL (ref 0.44–1.00)
GFR, Estimated: >60 mL/min (ref >60)
Glucose, Bld: 102 mg/dL — ABNORMAL HIGH (ref 70–99)
Potassium: 3.5 mmol/L (ref 3.5–5.1)
Sodium: 140 mmol/L (ref 135–145)

## 2022-05-12 LAB — SURGICAL PATHOLOGY

## 2022-05-12 LAB — CBC
HCT: 33.3 % — ABNORMAL LOW (ref 36.0–46.0)
Hemoglobin: 10.6 g/dL — ABNORMAL LOW (ref 12.0–15.0)
MCH: 29 pg (ref 26.0–34.0)
MCHC: 31.8 g/dL (ref 30.0–36.0)
MCV: 91 fL (ref 80.0–100.0)
Platelets: 149 10*3/uL — ABNORMAL LOW (ref 150–400)
RBC: 3.66 MIL/uL — ABNORMAL LOW (ref 3.87–5.11)
RDW: 13.2 % (ref 11.5–15.5)
WBC: 6.9 10*3/uL (ref 4.0–10.5)
nRBC: 0 % (ref 0.0–0.2)

## 2022-05-12 MED ORDER — TRAMADOL HCL 50 MG PO TABS: 50.0000 mg | ORAL_TABLET | Freq: Four times a day (QID) | ORAL | 0 refills | Status: DC | PRN

## 2022-05-12 NOTE — Progress Notes (Signed)
Patient was given discharge instructions, and all questions were answered.  Patient was stable for discharge and was taken to the main exit by wheelchair. 

## 2022-05-12 NOTE — Discharge Summary (Signed)
Patient ID: Chariss Shutt [id number] 57 y.o. 04-22-1965  05/10/2022  Discharge date and time: 05/12/2022 10:56 AM  Admitting Physician: Rosario Adie  Discharge Physician: Rosario Adie  Admission Diagnoses: Colonic mass [K63.89]  Discharge Diagnoses: Colon cancer, stage 1  Operations: XI ROBOT ASSISTED RIGHT COLECTOMY    Discharged Condition: good    Hospital Course: Patient was admitted to the med surg floor after surgery.  Diet was advanced as tolerated.  Patient began to have bowel function on postop day 2.  By postop day 2, she was tolerating a solid diet and pain was controlled with oral medications.  She was urinating without difficulty and ambulating without assistance.  Patient was felt to be in stable condition for discharge to home.   Consults: None  Significant Diagnostic Studies: labs: cbc, bmet  Treatments: IV hydration and surgery: robotic R colectomy  Disposition: Home

## 2022-05-12 NOTE — Progress Notes (Signed)
2 Days Post-Op  Subjective:  nausea, better this am.  Tolerating fulls. Min pain  Objective: Vital signs in last 24 hours: Temp:  [98.2 F (36.8 C)-99.6 F (37.6 C)] 98.2 F (36.8 C) (02/16 0507) Pulse Rate:  [57-67] 60 (02/16 0507) Resp:  [15-16] 15 (02/16 0507) BP: (90-106)/(38-60) 106/38 (02/16 0507) SpO2:  [97 %-100 %] 98 % (02/16 0507) Weight:  [64.6 kg] 64.6 kg (02/16 0500)   Intake/Output from previous day: 02/15 0701 - 02/16 0700 In: 940 [P.O.:640; I.V.:300] Out: 1450 [Urine:1450] Intake/Output this shift: No intake/output data recorded.   General appearance: alert and cooperative GI: soft, non-distended  Incision: no significant drainage  Lab Results:  Recent Labs    05/11/22 0416 05/12/22 0455  WBC 8.8 6.9  HGB 11.0* 10.6*  HCT 34.5* 33.3*  PLT 169 149*    BMET Recent Labs    05/11/22 0416 05/12/22 0455  NA 137 140  K 3.5 3.5  CL 105 106  CO2 23 26  GLUCOSE 142* 102*  BUN 6 <5*  CREATININE 0.58 0.62  CALCIUM 7.9* 8.1*    PT/INR No results for input(s): "LABPROT", "INR" in the last 72 hours. ABG No results for input(s): "PHART", "HCO3" in the last 72 hours.  Invalid input(s): "PCO2", "PO2"  MEDS, Scheduled  acetaminophen  1,000 mg Oral Q6H   alvimopan  12 mg Oral BID   calcium carbonate  1,250 mg Oral Daily   enoxaparin (LOVENOX) injection  40 mg Subcutaneous Q24H   escitalopram  10 mg Oral Daily   feeding supplement  237 mL Oral BID BM   gabapentin  300 mg Oral BID   saccharomyces boulardii  250 mg Oral BID    Studies/Results: No results found.  Assessment: s/p Procedure(s): XI ROBOT ASSISTED LAPAROSCOPIC RIGHT COLECTOMY Patient Active Problem List   Diagnosis Date Noted   Colonic mass 05/10/2022   Bradycardia    Frontal lobe contusion ([place]) 07/20/2016    PONV- resolved  Plan: Advance diet as tolerated SL IVF's  Possible d/c later today Ambulate   LOS: 2 days     .Rosario Adie, MD [facility]  Surgery, [place]    05/12/2022 7:31 AM

## 2022-05-12 NOTE — Discharge Instructions (Signed)
SURGERY: POST OP INSTRUCTIONS (Surgery for small bowel obstruction, colon resection, etc)   ######################################################################  EAT Gradually transition to a high fiber diet with a fiber supplement over the next few days after discharge  WALK Walk an hour a day.  Control your pain to do that.    CONTROL PAIN Control pain so that you can walk, sleep, tolerate sneezing/coughing, go up/down stairs.  HAVE A BOWEL MOVEMENT DAILY Keep your bowels regular to avoid problems.  OK to try a laxative to override constipation.  OK to use an antidairrheal to slow down diarrhea.  Call if not better after 2 tries  CALL IF YOU HAVE PROBLEMS/CONCERNS Call if you are still struggling despite following these instructions. Call if you have concerns not answered by these instructions  ######################################################################   DIET Follow a light diet the first few days at home.  Start with a bland diet such as soups, liquids, starchy foods, low fat foods, etc.  If you feel full, bloated, or constipated, stay on a ful liquid or pureed/blenderized diet for a few days until you feel better and no longer constipated. Be sure to drink plenty of fluids every day to avoid getting dehydrated (feeling dizzy, not urinating, etc.). Gradually add a fiber supplement to your diet over the next week.  Gradually get back to a regular solid diet.  Avoid fast food or heavy meals the first week as you are more likely to get nauseated. It is expected for your digestive tract to need a few months to get back to normal.  It is common for your bowel movements and stools to be irregular.  You will have occasional bloating and cramping that should eventually fade away.  Until you are eating solid food normally, off all pain medications, and back to regular activities; your bowels will not be normal. Focus on eating a low-fat, high fiber diet the rest of your life  (See Getting to Good Bowel Health, below).  CARE of your INCISION or WOUND  It is good for closed incisions and even open wounds to be washed every day.  Shower every day.  Short baths are fine.  Wash the incisions and wounds clean with soap & water.    You may leave closed incisions open to air if it is dry.   You may cover the incision with clean gauze & replace it after your daily shower for comfort.  STAPLES: You have skin staples.  Leave them in place & set up an appointment for them to be removed by a surgery office nurse ~10 days after surgery. = 1st week of January 2024    ACTIVITIES as tolerated Start light daily activities --- self-care, walking, climbing stairs-- beginning the day after surgery.  Gradually increase activities as tolerated.  Control your pain to be active.  Stop when you are tired.  Ideally, walk several times a day, eventually an hour a day.   Most people are back to most day-to-day activities in a few weeks.  It takes 4-8 weeks to get back to unrestricted, intense activity. If you can walk 30 minutes without difficulty, it is safe to try more intense activity such as jogging, treadmill, bicycling, low-impact aerobics, swimming, etc. Save the most intensive and strenuous activity for last (Usually 4-8 weeks after surgery) such as sit-ups, heavy lifting, contact sports, etc.  Refrain from any intense heavy lifting or straining until you are off narcotics for pain control.  You will have off days, but things should improve   week-by-week. DO NOT PUSH THROUGH PAIN.  Let pain be your guide: If it hurts to do something, don't do it.  Pain is your body warning you to avoid that activity for another week until the pain goes down. You may drive when you are no longer taking narcotic prescription pain medication, you can comfortably wear a seatbelt, and you can safely make sudden turns/stops to protect yourself without hesitating due to pain. You may have sexual intercourse when it  is comfortable. If it hurts to do something, stop.  MEDICATIONS Take your usually prescribed home medications unless otherwise directed.   Blood thinners:  Usually you can restart any strong blood thinners after the second postoperative day.  It is OK to take aspirin right away.     If you are on strong blood thinners (warfarin/Coumadin, Plavix, Xerelto, Eliquis, Pradaxa, etc), discuss with your surgeon, medicine PCP, and/or cardiologist for instructions on when to restart the blood thinner & if blood monitoring is needed (PT/INR blood check, etc).     PAIN CONTROL Pain after surgery or related to activity is often due to strain/injury to muscle, tendon, nerves and/or incisions.  This pain is usually short-term and will improve in a few months.  To help speed the process of healing and to get back to regular activity more quickly, DO THE FOLLOWING THINGS TOGETHER: Increase activity gradually.  DO NOT PUSH THROUGH PAIN Use Ice and/or Heat Try Gentle Massage and/or Stretching Take over the counter pain medication Take Narcotic prescription pain medication for more severe pain  Good pain control = faster recovery.  It is better to take more medicine to be more active than to stay in bed all day to avoid medications.  Increase activity gradually Avoid heavy lifting at first, then increase to lifting as tolerated over the next 6 weeks. Do not "push through" the pain.  Listen to your body and avoid positions and maneuvers than reproduce the pain.  Wait a few days before trying something more intense Walking an hour a day is encouraged to help your body recover faster and more safely.  Start slowly and stop when getting sore.  If you can walk 30 minutes without stopping or pain, you can try more intense activity (running, jogging, aerobics, cycling, swimming, treadmill, sex, sports, weightlifting, etc.) Remember: If it hurts to do it, then don't do it! Use Ice and/or Heat You will have swelling and  bruising around the incisions.  This will take several weeks to resolve. Ice packs or heating pads (6-8 times a day, 30-60 minutes at a time) will help sooth soreness & bruising. Some people prefer to use ice alone, heat alone, or alternate between ice & heat.  Experiment and see what works best for you.  Consider trying ice for the first few days to help decrease swelling and bruising; then, switch to heat to help relax sore spots and speed recovery. Shower every day.  Short baths are fine.  It feels good!  Keep the incisions and wounds clean with soap & water.   Try Gentle Massage and/or Stretching Massage at the area of pain many times a day Stop if you feel pain - do not overdo it Take over the counter pain medication This helps the muscle and nerve tissues become less irritable and calm down faster Choose ONE of the following over-the-counter anti-inflammatory medications: Acetaminophen 500mg tabs (Tylenol) 1-2 pills with every meal and just before bedtime (avoid if you have liver problems or if you have   acetaminophen in you narcotic prescription) Naproxen 220mg tabs (ex. Aleve, Naprosyn) 1-2 pills twice a day (avoid if you have kidney, stomach, IBD, or bleeding problems) Ibuprofen 200mg tabs (ex. Advil, Motrin) 3-4 pills with every meal and just before bedtime (avoid if you have kidney, stomach, IBD, or bleeding problems) Take with food/snack several times a day as directed for at least 2 weeks to help keep pain / soreness down & more manageable. Take Narcotic prescription pain medication for more severe pain A prescription for strong pain control is often given to you upon discharge (for example: oxycodone/Percocet, hydrocodone/Norco/Vicodin, or tramadol/Ultram) Take your pain medication as prescribed. Be mindful that most narcotic prescriptions contain Tylenol (acetaminophen) as well - avoid taking too much Tylenol. If you are having problems/concerns with the prescription medicine (does  not control pain, nausea, vomiting, rash, itching, etc.), please call us [phone] to see if we need to switch you to a different pain medicine that will work better for you and/or control your side effects better. If you need a refill on your pain medication, you must call the office before 4 pm and on weekdays only.  By federal law, prescriptions for narcotics cannot be called into a pharmacy.  They must be filled out on paper & picked up from our office by the patient or authorized caretaker.  Prescriptions cannot be filled after 4 pm nor on weekends.    WHEN TO CALL US [phone] Severe uncontrolled or worsening pain  Fever over 101 F (38.5 C) Concerns with the incision: Worsening pain, redness, rash/hives, swelling, bleeding, or drainage Reactions / problems with new medications (itching, rash, hives, nausea, etc.) Nausea and/or vomiting Difficulty urinating Difficulty breathing Worsening fatigue, dizziness, lightheadedness, blurred vision Other concerns If you are not getting better after two weeks or are noticing you are getting worse, contact our office [phone] for further advice.  We may need to adjust your medications, re-evaluate you in the office, send you to the emergency room, or see what other things we can do to help. The clinic staff is available to answer your questions during regular business hours (8:30am-5pm).  Please don't hesitate to call and ask to speak to one of our nurses for clinical concerns.    A surgeon from [facility] is always on call at the hospitals 24 hours/day If you have a medical emergency, go to the nearest emergency room or call 911.  FOLLOW UP in our office One the day of your discharge from the hospital (or the next business weekday), please call [facility] to set up or confirm an appointment to see your surgeon in the office for a follow-up appointment.  Usually it is 2-3 weeks after your surgery.   If you  have skin staples at your incision(s), let the office know so we can set up a time in the office for the nurse to remove them (usually around 10 days after surgery). Make sure that you call for appointments the day of discharge (or the next business weekday) from the hospital to ensure a convenient appointment time. IF YOU HAVE DISABILITY OR FAMILY LEAVE FORMS, BRING THEM TO THE OFFICE FOR PROCESSING.  DO NOT GIVE THEM TO YOUR DOCTOR.  [facility] [address], [address], [place], [place]  [address] ? [phone] - Main [phone] - Toll Free,  [phone] - Fax [email or web address]    GETTING TO GOOD BOWEL HEALTH. It is expected for your digestive tract to   need a few months to get back to normal.  It is common for your bowel movements and stools to be irregular.  You will have occasional bloating and cramping that should eventually fade away.  Until you are eating solid food normally, off all pain medications, and back to regular activities; your bowels will not be normal.   Avoiding constipation The goal: ONE SOFT BOWEL MOVEMENT A DAY!    Drink plenty of fluids.  Choose water first. TAKE A FIBER SUPPLEMENT EVERY DAY THE REST OF YOUR LIFE During your first week back home, gradually add back a fiber supplement every day Experiment which form you can tolerate.   There are many forms such as powders, tablets, wafers, gummies, etc Psyllium bran (Metamucil), methylcellulose (Citrucel), Miralax or Glycolax, Benefiber, Flax Seed.  Adjust the dose week-by-week (1/2 dose/day to 6 doses a day) until you are moving your bowels 1-2 times a day.  Cut back the dose or try a different fiber product if it is giving you problems such as diarrhea or bloating. Sometimes a laxative is needed to help jump-start bowels if constipated until the fiber supplement can help regulate your bowels.  If you are tolerating eating & you are farting, it is okay to try a gentle  laxative such as double dose MiraLax, prune juice, or Milk of Magnesia.  Avoid using laxatives too often. Stool softeners can sometimes help counteract the constipating effects of narcotic pain medicines.  It can also cause diarrhea, so avoid using for too long. If you are still constipated despite taking fiber daily, eating solids, and a few doses of laxatives, call our office. Controlling diarrhea Try drinking liquids and eating bland foods for a few days to avoid stressing your intestines further. Avoid dairy products (especially milk & ice cream) for a short time.  The intestines often can lose the ability to digest lactose when stressed. Avoid foods that cause gassiness or bloating.  Typical foods include beans and other legumes, cabbage, broccoli, and dairy foods.  Avoid greasy, spicy, fast foods.  Every person has some sensitivity to other foods, so listen to your body and avoid those foods that trigger problems for you. Probiotics (such as active yogurt, Align, etc) may help repopulate the intestines and colon with normal bacteria and calm down a sensitive digestive tract Adding a fiber supplement gradually can help thicken stools by absorbing excess fluid and retrain the intestines to act more normally.  Slowly increase the dose over a few weeks.  Too much fiber too soon can backfire and cause cramping & bloating. It is okay to try and slow down diarrhea with a few doses of antidiarrheal medicines.   Bismuth subsalicylate (ex. Kayopectate, Pepto Bismol) for a few doses can help control diarrhea.  Avoid if pregnant.   Loperamide (Imodium) can slow down diarrhea.  Start with one tablet (2mg) first.  Avoid if you are having fevers or severe pain.  ILEOSTOMY PATIENTS WILL HAVE CHRONIC DIARRHEA since their colon is not in use.    Drink plenty of liquids.  You will need to drink even more glasses of water/liquid a day to avoid getting dehydrated. Record output from your ileostomy.  Expect to empty  the bag every 3-4 hours at first.  Most people with a permanent ileostomy empty their bag 4-6 times at the least.   Use antidiarrheal medicine (especially Imodium) several times a day to avoid getting dehydrated.  Start with a dose at bedtime & breakfast.  Adjust up or   down as needed.  Increase antidiarrheal medications as directed to avoid emptying the bag more than 8 times a day (every 3 hours). Work with your wound ostomy nurse to learn care for your ostomy.  See ostomy care instructions. TROUBLESHOOTING IRREGULAR BOWELS 1) Start with a soft & bland diet. No spicy, greasy, or fried foods.  2) Avoid gluten/wheat or dairy products from diet to see if symptoms improve. 3) Miralax 17gm or flax seed mixed in 8oz. water or juice-daily. May use 2-4 times a day as needed. 4) Gas-X, Phazyme, etc. as needed for gas & bloating.  5) Prilosec (omeprazole) over-the-counter as needed 6)  Consider probiotics (Align, Activa, etc) to help calm the bowels down  Call your doctor if you are getting worse or not getting better.  Sometimes further testing (cultures, endoscopy, X-ray studies, CT scans, bloodwork, etc.) may be needed to help diagnose and treat the cause of the diarrhea. [facility] [address], [address], [place], [place]  [address] [phone] - Main.    [phone]  - Toll Free.   [phone] - Fax [email or web address]   ###############################   #######################################################  Ostomy Support Information  You've heard that people get along just fine with only one of their eyes, or one of their lungs, or one of their kidneys. But you also know that you have only one intestine and only one bladder, and that leaves you feeling awfully empty, both physically and emotionally: You think no other people go around without part of their intestine with the ends of their intestines sticking out through their abdominal walls.    YOU ARE NOT ALONE.  There are nearly three quarters of a million people in the [place] who have an ostomy; people who have had surgery to remove all or part of their colons or bladders.   There is even a national association, the [employer] with over 350 local affiliated support groups that are organized by volunteers who provide peer support and counseling. [employer] has a toll free telephone num-ber, 800-826-0826 and an educational, interactive website, www.ostomy.org   An ostomy is an opening in the belly (abdominal wall) made by surgery. Ostomates are people who have had this procedure. The opening (stoma) allows the kidney or bowel to grdischarge waste. An external pouch covers the stoma to collect waste. Pouches are are a simple bag and are odor free. Different companies have disposable or reusable pouches to fit one's lifestyle. An ostomy can either be temporary or permanent.   THERE ARE THREE MAIN TYPES OF OSTOMIES Colostomy. A colostomy is a surgically created opening in the large intestine (colon). Ileostomy. An ileostomy is a surgically created opening in the small intestine. Urostomy. A urostomy is a surgically created opening to divert urine away from the bladder.  OSTOMY Care  The following guidelines will make care of your colostomy easier. Keep this information close by for quick reference.  Helpful DIET hints Eat a well-balanced diet including vegetables and fresh fruits. Eat on a regular schedule.  Drink at least 6 to 8 glasses of fluids daily. Eat slowly in a relaxed atmosphere. Chew your food thoroughly. Avoid chewing gum, smoking, and drinking from a straw. This will help decrease the amount of air you swallow, which may help reduce gas. Eating yogurt or drinking buttermilk may help reduce gas.  To control gas at night, do not eat after 8 p.m. This will give your bowel time to quiet down before you go   to bed.  If gas is a problem, you can purchase  Beano. Sprinkle Beano on the first bite of food before eating to reduce gas. It has no flavor and should not change the taste of your food. You can buy Beano over the counter at your local drugstore.  Foods like fish, onions, garlic, broccoli, asparagus, and cabbage produce odor. Although your pouch is odor-proof, if you eat these foods you may notice a stronger odor when emptying your pouch. If this is a concern, you may want to limit these foods in your diet.  If you have an ileostomy, you will have chronic diarrhea & need to drink more liquids to avoid getting dehydrated.  Consider antidiarrheal medicine like imodium (loperamide) or Lomotil to help slow down bowel movements / diarrhea into your ileostomy bag.  GETTING TO GOOD BOWEL HEALTH WITH AN ILEOSTOMY    With the colon bypassed & not in use, you will have small bowel diarrhea.   It is important to thicken & slow your bowel movements down.   The goal: 4-6 small BOWEL MOVEMENTS A DAY It is important to drink plenty of liquids to avoid getting dehydrated  CONTROLLING ILEOSTOMY DIARRHEA  TAKE A FIBER SUPPLEMENT (FiberCon or Benefiner soluble fiber) twice a day - to thicken stools by absorbing excess fluid and retrain the intestines to act more normally.  Slowly increase the dose over a few weeks.  Too much fiber too soon can backfire and cause cramping & bloating.  TAKE AN IRON SUPPLEMENT twice a day to naturally constipate your bowels.  Usually ferrous sulfate 325mg twice a day)  TAKE ANTI-DIARRHEAL MEDICINES: Loperamide (Imodium) can slow down diarrhea.  Start with two tablets (= 4mg) first and then try one tablet every 6 hours.  Can go up to 2 pills four times day (8 pills of 2mg max) Avoid if you are having fevers or severe pain.  If you are not better or start feeling worse, stop all medicines and call your doctor for advice LoMotil (Diphenoxylate / Atropine) is another medicine that can constipate & slow down bowel moevements Pepto  Bismol (bismuth) can gently thicken bowels as well  If diarrhea is worse,: drink plenty of liquids and try simpler foods for a few days to avoid stressing your intestines further. Avoid dairy products (especially milk & ice cream) for a short time.  The intestines often can lose the ability to digest lactose when stressed. Avoid foods that cause gassiness or bloating.  Typical foods include beans and other legumes, cabbage, broccoli, and dairy foods.  Every person has some sensitivity to other foods, so listen to our body and avoid those foods that trigger problems for you.Call your doctor if you are getting worse or not better.  Sometimes further testing (cultures, endoscopy, X-ray studies, bloodwork, etc) may be needed to help diagnose and treat the cause of the diarrhea. Take extra anti-diarrheal medicines (maximum is 8 pills of 2mg loperamide a day)   Tips for POUCHING an OSTOMY   Changing Your Pouch The best time to change your pouch is in the morning, before eating or drinking anything. Your stoma can function at any time, but it will function more after eating or drinking.   Applying the pouching system  Place all your equipment close at hand before removing your pouch.  Wash your hands.  Stand or sit in front of a mirror. Use the position that works best for you. Remember that you must keep the skin around the stoma   wrinkle-free for a good seal.  Gently remove the used pouch (1-piece system) or the pouch and old wafer (2-piece system). Empty the pouch into the toilet. Save the closure clip to use again.  Wash the stoma itself and the skin around the stoma. Your stoma may bleed a little when being washed. This is normal. Rinse and pat dry. You may use a wash cloth or soft paper towels (like Bounty), mild soap (like Dial, Safeguard, or Ivory), and water. Avoid soaps that contain perfumes or lotions.  For a new pouch (1-piece system) or a new wafer (2-piece system), measure your  stoma using the stoma guide in each box of supplies.  Trace the shape of your stoma onto the back of the new pouch or the back of the new wafer. Cut out the opening. Remove the paper backing and set it aside.  Optional: Apply a skin barrier powder to surrounding skin if it is irritated (bare or weeping), and dust off the excess. Optional: Apply a skin-prep wipe (such as Skin Prep or All-Kare) to the skin around the stoma, and let it dry. Do not apply this solution if the skin is irritated (red, tender, or broken) or if you have shaved around the stoma. Optional: Apply a skin barrier paste (such as Stomahesive, Coloplast, or Premium) around the opening cut in the back of the pouch or wafer. Allow it to dry for 30 to 60 seconds.  Hold the pouch (1-piece system) or wafer (2-piece system) with the sticky side toward your body. Make sure the skin around the stoma is wrinkle-free. Center the opening on the stoma, then press firmly to your abdomen (Fig. 4). Look in the mirror to check if you are placing the pouch, or wafer, in the right position. For a 2-piece system, snap the pouch onto the wafer. Make sure it snaps into place securely.  Place your hand over the stoma and the pouch or wafer for about 30 seconds. The heat from your hand can help the pouch or wafer stick to your skin.  Add deodorant (such as Super Banish or Nullo) to your pouch. Other options include food extracts such as vanilla oil and peppermint extract. Add about 10 drops of the deodorant to the pouch. Then apply the closure clamp. Note: Do not use toxic  chemicals or commercial cleaning agents in your pouch. These substances may harm the stoma.  Optional: For extra seal, apply tape to all 4 sides around the pouch or wafer, as if you were framing a picture. You may use any brand of medical adhesive tape. Change your pouch every 5 to 7 days. Change it immediately if a leak occurs.  Wash your hands afterwards.  If you are wearing a  2-piece system, you may use 2 new pouches per week and alternate them. Rinse the pouch with mild soap and warm water and hang it to dry for the next day. Apply the fresh pouch. Alternate the 2 pouches like this for a week. After a week, change the wafer and begin with 2 new pouches. Place the old pouches in a plastic bag, and put them in the trash.   LIVING WITH AN OSTOMY  Emptying Your Pouch Empty your pouch when it is one-third full (of urine, stool, and/or gas). If you wait until your pouch is fuller than this, it will be more difficult to empty and more noticeable. When you empty your pouch, either put toilet paper in the toilet bowl first, or flush the   toilet while you empty the pouch. This will reduce splashing. You can empty the pouch between your legs or to one side while sitting, or while standing or stooping. If you have a 2-piece system, you can snap off the pouch to empty it. Remember that your stoma may function during this time. If you wish to rinse your pouch after you empty it, a turkey baster can be helpful. When using a baster, squirt water up into the pouch through the opening at the bottom. With a 2-piece system, you can snap off the pouch to rinse it. After rinsing  your pouch, empty it into the toilet. When rinsing your pouch at home, put a few granules of Dreft soap in the rinse water. This helps lubricate and freshen your pouch. The inside of your pouch can be sprayed with non-stick cooking oil (Pam spray). This may help reduce stool sticking to the inside of the pouch.  Bathing You may shower or bathe with your pouch on or off. Remember that your stoma may function during this time.  The materials you use to wash your stoma and the skin around it should be clean, but they do not need to be sterile.  Wearing Your Pouch During hot weather, or if you perspire a lot in general, wear a cover over your pouch. This may prevent a rash on your skin under the pouch. Pouch covers are  sold at ostomy supply stores. Wear the pouch inside your underwear for better support. Watch your weight. Any gain or loss of 10 to 15 pounds or more can change the way your pouch fits.  Going Away From Home A collapsible cup (like those that come in travel kits) or a soft plastic squirt bottle with a pull-up top (like a travel bottle for shampoo) can be used for rinsing your pouch when you are away from home. Tilt the opening of the pouch at an upward angle when using a cup to rinse.  Carry wet wipes or extra tissues to use in public bathrooms.  Carry an extra pouching system with you at all times.  Never keep ostomy supplies in the glove compartment of your car. Extreme heat or cold can damage the skin barriers and adhesive wafers on the pouch.  When you travel, carry your ostomy supplies with you at all times. Keep them within easy reach. Do not pack ostomy supplies in baggage that will be checked or otherwise separated from you, because your baggage might be lost. If you're traveling out of the country, it is helpful to have a letter stating that you are carrying ostomy supplies as a medical necessity.  If you need ostomy supplies while traveling, look in the yellow pages of the telephone book under "Surgical Supplies." Or call the local ostomy organization to find out where supplies are available.  Do not let your ostomy supplies get low. Always order new pouches before you use the last one.  Reducing Odor Limit foods such as broccoli, cabbage, onions, fish, and garlic in your diet to help reduce odor. Each time you empty your pouch, carefully clean the opening of the pouch, both inside and outside, with toilet paper. Rinse your pouch 1 or 2 times daily after you empty it (see directions for emptying your pouch and going away from home). Add deodorant (such as Super Banish or Nullo) to your pouch. Use air deodorizers in your bathroom. Do not add aspirin to your pouch. Even though  aspirin can help prevent odor, it   could cause ulcers on your stoma.  When to call the doctor Call the doctor if you have any of the following symptoms: Purple, black, or white stoma Severe cramps lasting more than 6 hours Severe watery discharge from the stoma lasting more than 6 hours No output from the colostomy for 3 days Excessive bleeding from your stoma Swelling of your stoma to more than 1/2-inch larger than usual Pulling inward of your stoma below skin level Severe skin irritation or deep ulcers Bulging or other changes in your abdomen  When to call your ostomy nurse Call your ostomy/enterostomal therapy (WOCN) nurse if any of the following occurs: Frequent leaking of your pouching system Change in size or appearance of your stoma, causing discomfort or problems with your pouch Skin rash or rawness Weight gain or loss that causes problems with your pouch     FREQUENTLY ASKED QUESTIONS   Why haven't you met any of these folks who have an ostomy?  Well, maybe you have! You just did not recognize them because an ostomy doesn't show. It can be kept secret if you wish. Why, maybe some of your best friends, office associates or neighbors have an ostomy ... you never can tell. People facing ostomy surgery have many quality-of-life questions like: Will you bulge? Smell? Make noises? Will you feel waste leaving your body? Will you be a captive of the toilet? Will you starve? Be a social outcast? Get/stay married? Have babies? Easily bathe, go swimming, bend over?  OK, let's look at what you can expect:   Will you bulge?  Remember, without part of the intestine or bladder, and its contents, you should have a flatter tummy than before. You can expect to wear, with little exception, what you wore before surgery ... and this in-cludes tight clothing and bathing suits.   Will you smell?  Today, thanks to modern odor proof pouching systems, you can walk into an ostomy support group  meeting and not smell anything that is foul or offensive. And, for those with an ileostomy or colostomy who are concerned about odor when emptying their pouch, there are in-pouch deodorants that can be used to eliminate any waste odors that may exist.   Will you make noises?  Everyone produces gas, especially if they are an air-swallower. But intestinal sounds that occur from time to time are no differ-ent than a gurgling tummy, and quite often your clothing will muffle any sounds.   Will you feel the waste discharges?  For those with a colostomy or ileostomy there might be a slight pressure when waste leaves your body, but understand that the intestines have no nerve endings, so there will be no unpleasant sensations. Those with a urostomy will probably be unaware of any kidney drainage.   Will you be a captive of the toilet?  Immediately post-op you will spend more time in the bathroom than you will after your body recovers from surgery. Every person is different, but on average those with an ileostomy or urostomy may empty their pouches 4 to 6 times a day; a little  less if you have a colostomy. The average wear time between pouch system changes is 3 to 5 days and the changing process should take less than 30 minutes.   Will I need to be on a special diet? Most people return to their normal diet when they have recovered from surgery. Be sure to chew your food well, eat a well-balanced diet and drink plenty of fluids. If   you experience problems with a certain food, wait a couple of weeks and try it again.  Will there be odor and noises? Pouching systems are designed to be odor-proof or odor-resistant. There are deodorants that can be used in the pouch. Medications are also available to help reduce odor. Limit gas-producing foods and carbonated beverages. You will experience less gas and fewer noises as you heal from surgery.  How much time will it take to care for my ostomy? At first, you may  spend a lot of time learning about your ostomy and how to take care of it. As you become more comfortable and skilled at changing the pouching system, it will take very little time to care for it.   Will I be able to return to work? People with ostomies can perform most jobs. As soon as you have healed from surgery, you should be able to return to work. Heavy lifting (more than 10 pounds) may be discouraged.   What about intimacy? Sexual relationships and intimacy are important and fulfilling aspects of your life. They should continue after ostomy surgery. Intimacy-related concerns should be discussed openly between you and your partner.   Can I wear regular clothing? You do not need to wear special clothing. Ostomy pouches are fairly flat and barely noticeable. Elastic undergarments will not hurt the stoma or prevent the ostomy from functioning.   Can I participate in sports? An ostomy should not limit your involvement in sports. Many people with ostomies are runners, skiers, swimmers or participate in other active lifestyles. Talk with your caregiver first before doing heavy physical activity.  Will you starve?  Not if you follow doctor's orders at each stage of your post-op adjustment. There is no such thing as an "ostomy diet". Some people with an ostomy will be able to eat and tolerate anything; others may find diffi-culty with some foods. Each person is an individual and must determine, by trial, what is best for them. A good practice for all is to drink plenty of water.   Will you be a social outcast?  Have you met anyone who has an ostomy and is a social outcast? Why should you be the first? Only your attitude and self image will effect how you are treated. No confi-dent person is an outcast.    PROFESSIONAL HELP   Resources are available if you need help or have questions about your ostomy.   Specially trained nurses called Wound, Ostomy Continence Nurses (WOCN) are available for  consultation in most major medical centers.  Consider getting an ostomy consult at an outpatient ostomy clinic.   [facility] has an [facility] Clinic run by an WOCN ostomy nurse at the [facility].  [phone]. [facility] can help set up an appointment   The [employer] Ostomy Association (UOA) is a group made up of many local chapters throughout the [place]. These local groups hold meetings and provide support to prospective and existing ostomates. They sponsor educational events and have qualified visitors to make personal or telephone visits. Contact the UOA for the chapter nearest you and for other educational publications.  More detailed information can be found in Colostomy Guide, a publication of the United Ostomy Association (UOA). Contact UOA at 1-800-826-0826 or visit their web site at www.uoaa.org. The website contains links to other sites, suppliers and resources.  [facility] Secure Start Services: Start at the website to enlist for support.  Your Wound Ostomy (WOCN) nurse may have started this   process. https://www.[facility].com/en/securestart Secure Start services are designed to support people as they live their lives with an ostomy or neurogenic bladder. Enrolling is easy and at no cost to the patient. We realize that each person's needs and life journey are different. Through Secure Start services, we want to help people live their life, their way.  #######################################################  

## 2022-05-12 NOTE — Progress Notes (Signed)
  Transition of Care [facility]) Screening Note   Patient Details  Name: Nancy Walker Date of Birth: 1965-12-17   Transition of [facility]) CM/SW Contact:    Henrietta Dine, RN Phone Number: 05/12/2022, 10:38 AM    Transition of Care Department [facility]) has reviewed patient and no TOC needs have been identified at this time. We will continue to monitor patient advancement through interdisciplinary progression rounds. If new patient transition needs arise, please place a TOC consult.

## 2022-05-17 ENCOUNTER — Ambulatory Visit: Payer: Commercial Managed Care - HMO | Admitting: Vascular Surgery

## 2022-06-02 ENCOUNTER — Other Ambulatory Visit: Payer: Self-pay | Admitting: General Surgery

## 2022-06-02 DIAGNOSIS — C182 Malignant neoplasm of ascending colon: Secondary | ICD-10-CM

## 2022-06-07 ENCOUNTER — Ambulatory Visit: Payer: Commercial Managed Care - HMO | Admitting: Vascular Surgery

## 2022-06-12 NOTE — Progress Notes (Signed)
I spoke with Nancy Walker and let her know at this point there is no role for medical oncology in the treatment of her colon cancer.  She is scheduled for Ct Scans for staging per Dr Marcello Moores.  I told her that if something suspicious shows up on her Ct Scans Dr Marcello Moores will refer her to medical oncology but at this time we do not need to see her.  All questions were answered.  She verbalized understanding.

## 2022-06-30 ENCOUNTER — Ambulatory Visit
Admission: RE | Admit: 2022-06-30 | Discharge: 2022-06-30 | Disposition: A | Discharge status: Home / Self Care | Payer: Commercial Managed Care - HMO | Source: Ambulatory Visit | Attending: General Surgery | Admitting: General Surgery

## 2022-06-30 ENCOUNTER — Other Ambulatory Visit: Payer: Commercial Managed Care - HMO

## 2022-06-30 DIAGNOSIS — C182 Malignant neoplasm of ascending colon: Secondary | ICD-10-CM

## 2022-06-30 MED ORDER — IOPAMIDOL (ISOVUE-300) INJECTION 61%
100.0000 mL | Freq: Once | INTRAVENOUS | Status: AC | PRN
  Administered 2022-06-30: 100 mL via INTRAVENOUS

## 2022-08-04 ENCOUNTER — Other Ambulatory Visit: Payer: Self-pay | Admitting: Obstetrics and Gynecology

## 2022-08-04 DIAGNOSIS — M810 Age-related osteoporosis without current pathological fracture: Secondary | ICD-10-CM

## 2022-08-30 ENCOUNTER — Encounter: Payer: Self-pay | Admitting: Vascular Surgery

## 2022-08-30 ENCOUNTER — Ambulatory Visit (INDEPENDENT_AMBULATORY_CARE_PROVIDER_SITE_OTHER): Payer: Commercial Managed Care - HMO | Admitting: Vascular Surgery

## 2022-08-30 VITALS — BP 109/62 | HR 47 | Temp 98.1°F | Resp 18 | Ht 66.0 in | Wt 140.0 lb

## 2022-08-30 DIAGNOSIS — I872 Venous insufficiency (chronic) (peripheral): Secondary | ICD-10-CM | POA: Diagnosis not present

## 2022-08-30 NOTE — Progress Notes (Signed)
REASON FOR VISIT:   Follow-up of varicose veins left leg.  MEDICAL ISSUES:   CHRONIC VENOUS INSUFFICIENCY: This patient has CEAP C2 venous disease.  She has painful varicose veins in the posterior left calf.  She does have some deep venous reflux in the popliteal vein.  Her left great saphenous vein has been previously ablated.  She does have significant reflux in her small saphenous vein which is feeding these varicosities in her lower leg.  We have discussed conservative measures.  I encouraged her to avoid prolonged sitting and standing.  We discussed importance of exercise specifically walking and water aerobics.  She has been exercising.  She has been wearing her thigh-high compression stockings with a gradient of 20 to 30 mmHg.  We also discussed the importance of daily leg elevation and the proper positioning for this.  Fortunately she has a healthy weight.  Given that she is having persistent symptoms persist despite these measures I think she would be a good candidate for laser ablation of the left small saphenous vein with 10-20 stabs.  I have discussed the indications for endovenous laser ablation of the left SSV, that is to lower the pressure in the veins and potentially help relieve the symptoms from venous hypertension.  I have also discussed alternative options such as conservative treatment as described above. I have discussed the potential complications of the procedure, including bleeding, bruising, leg swelling, deep venous thrombosis (<1% risk), or failure of the vein to close <1% risk).  I have also explained that venous insufficiency is a chronic disease, and that the patient is at risk for recurrent varicose veins in the future.  All of the patient's questions were encouraged and answered. They are agreeable to proceed.   I have discussed with the patient the indications for stab phlebectomy.  I have explained to the patient that that will have small scars from the stab  incisions.  I explained that the other risks include bruising, bleeding, and phlebitis.    HPI:   Nancy Walker is a pleasant 57 y.o. female who was seen by Dr. Elna Breslow on 02/07/2022 with chronic venous insufficiency.  She had CEAP C4 venous disease (hyperpigmentation).  Her left great saphenous vein had been previously ablated.  She did have reflux in her small saphenous vein which was dilated.  She was encouraged to exercise, elevate her legs, and was prescribed thigh-high compression stockings with a gradient of 20 to 30 mmHg.  She comes in for a follow-up visit.  Since she was seen last, she has been elevating her legs and exercising.  She has been wearing her thigh-high compression stockings which help her symptoms some.  However she still having significant symptoms in her left leg.  She describes aching pain and a tired feeling in her leg associated with prolonged sitting and standing.  The symptoms are relieved with elevation.  Her compression stockings helped some.  She has had no previous history of DVT.  The left great saphenous vein has been previously ablated.  Past Medical History:  Diagnosis Date   Anxiety    Bradycardia    Depression     Family History  Problem Relation Age of Onset   Cancer Mother     SOCIAL HISTORY: Social History   Tobacco Use   Smoking status: Never   Smokeless tobacco: Never  Substance Use Topics   Alcohol use: No    Allergies  Allergen Reactions   Latex Rash  No breathing problems   Penicillins Rash   Steri-Strip Compound Benzoin [Benzoin Compound] Rash    Redness,     Current Outpatient Medications  Medication Sig Dispense Refill   alendronate (FOSAMAX) 70 MG tablet Take 70 mg by mouth every Sunday.     CALCIUM PO Take 1 tablet by mouth daily.     escitalopram (LEXAPRO) 10 MG tablet Take 10 mg by mouth daily.     SUMAtriptan (IMITREX) 50 MG tablet Take 50 mg by mouth every 2 (two) hours as needed for migraine.     traMADol  (ULTRAM) 50 MG tablet Take 1-2 tablets (50-100 mg total) by mouth every 6 (six) hours as needed for moderate pain or severe pain. (Patient not taking: Reported on 08/30/2022) 30 tablet 0   No current facility-administered medications for this visit.    REVIEW OF SYSTEMS:  [X]  denotes positive finding, [ ]  denotes negative finding Cardiac  Comments:  Chest pain or chest pressure:    Shortness of breath upon exertion:    Short of breath when lying flat:    Irregular heart rhythm:        Vascular    Pain in calf, thigh, or hip brought on by ambulation:    Pain in feet at night that wakes you up from your sleep:     Blood clot in your veins:    Leg swelling:         Pulmonary    Oxygen at home:    Productive cough:     Wheezing:         Neurologic    Sudden weakness in arms or legs:     Sudden numbness in arms or legs:     Sudden onset of difficulty speaking or slurred speech:    Temporary loss of vision in one eye:     Problems with dizziness:         Gastrointestinal    Blood in stool:     Vomited blood:         Genitourinary    Burning when urinating:     Blood in urine:        Psychiatric    Major depression:         Hematologic    Bleeding problems:    Problems with blood clotting too easily:        Skin    Rashes or ulcers:        Constitutional    Fever or chills:     PHYSICAL EXAM:   Vitals:   08/30/22 1418  BP: 109/62  Pulse: (!) 47  Resp: 18  Temp: 98.1 F (36.7 C)  TempSrc: Temporal  SpO2: 99%  Weight: 140 lb (63.5 kg)  Height: 5\' 6"  (1.676 m)   GENERAL: The patient is a well-nourished female, in no acute distress. The vital signs are documented above. CARDIAC: There is a regular rate and rhythm.  VASCULAR: I do not detect carotid bruits. She has palpable pedal pulses. She has dilated varicose veins in her posterior left calf as documented in the photograph below.  I did look at her left small saphenous vein myself with the SonoSite.  It is  dilated up to 6-1/2 mm in the proximal calf.  In the mid calf it is about 5 mm.  This feeds the varicosities in her left leg.  The small saphenous vein joins the popliteal vein.  There is no vein of Giacomini. PULMONARY: There is good air exchange  bilaterally without wheezing or rales. ABDOMEN: Soft and non-tender with normal pitched bowel sounds.  MUSCULOSKELETAL: There are no major deformities or cyanosis. NEUROLOGIC: No focal weakness or paresthesias are detected. SKIN: There are no ulcers or rashes noted. PSYCHIATRIC: The patient has a normal affect.  DATA:    VENOUS DUPLEX: I have reviewed the venous duplex scan that was done back in November of last year.  This was of the left lower extremity only.  There was no evidence of DVT.  There was deep venous reflux in the popliteal vein.  The left great saphenous vein had been previously ablated.  There was superficial venous reflux in the left small saphenous vein which was 5 mm in diameter in the proximal calf.  Results of the study are summarized in the diagram below.    Waverly Ferrari [facility] [phone]

## 2022-09-06 ENCOUNTER — Other Ambulatory Visit: Payer: Self-pay | Admitting: *Deleted

## 2022-09-06 DIAGNOSIS — I83812 Varicose veins of left lower extremities with pain: Secondary | ICD-10-CM

## 2022-09-12 ENCOUNTER — Other Ambulatory Visit: Payer: Self-pay | Admitting: *Deleted

## 2022-09-12 MED ORDER — LORAZEPAM 1 MG PO TABS: ORAL_TABLET | ORAL | 0 refills | Status: DC

## 2022-09-20 ENCOUNTER — Ambulatory Visit (INDEPENDENT_AMBULATORY_CARE_PROVIDER_SITE_OTHER): Payer: Commercial Managed Care - HMO | Admitting: Vascular Surgery

## 2022-09-20 ENCOUNTER — Encounter: Payer: Self-pay | Admitting: Vascular Surgery

## 2022-09-20 VITALS — BP 97/58 | HR 51 | Temp 98.2°F | Resp 18 | Ht 67.0 in | Wt 140.0 lb

## 2022-09-20 DIAGNOSIS — I83812 Varicose veins of left lower extremities with pain: Secondary | ICD-10-CM | POA: Diagnosis not present

## 2022-09-20 DIAGNOSIS — I872 Venous insufficiency (chronic) (peripheral): Secondary | ICD-10-CM

## 2022-09-20 HISTORY — PX: ENDOVENOUS ABLATION SAPHENOUS VEIN W/ LASER: SUR449

## 2022-09-20 NOTE — Progress Notes (Signed)
   Patient name: Nancy Walker MRN: [id number] DOB: 27-Jun-1965 Sex: female  REASON FOR VISIT: For laser ablation of the left small saphenous vein and 10-20 stabs  HPI: Nancy Walker is a 57 y.o. female who I saw on 08/30/2022.  She has CEAP C2 venous disease.  She has painful varicose veins in her posterior left calf.  She does have some reflux in the popliteal vein in the deep system.  Her left great saphenous vein has been previously ablated.  She has significant reflux in the small saphenous vein which is feeding the varicosities in her lower leg.  She has failed conservative treatment and therefore wishes to proceed with laser ablation of the left small saphenous vein and 10-20 stabs.   Current Outpatient Medications  Medication Sig Dispense Refill   alendronate (FOSAMAX) 70 MG tablet Take 70 mg by mouth every Sunday.     CALCIUM PO Take 1 tablet by mouth daily.     escitalopram (LEXAPRO) 10 MG tablet Take 10 mg by mouth daily.     SUMAtriptan (IMITREX) 50 MG tablet Take 50 mg by mouth every 2 (two) hours as needed for migraine.     No current facility-administered medications for this visit.    PHYSICAL EXAM: Vitals:   09/20/22 1111  BP: (!) 97/58  Pulse: (!) 51  Resp: 18  Temp: 98.2 F (36.8 C)  TempSrc: Temporal  SpO2: 99%  Weight: 140 lb (63.5 kg)  Height: 5\' 7"  (1.702 m)    PROCEDURE: Laser ablation of left small saphenous vein with 10-20 stabs  TECHNIQUE: The patient was taken to the exam room and the dilated varicose veins were marked with the patient standing.  The patient was then placed prone.  The left leg was prepped and draped in usual sterile fashion.  After the skin was anesthetized with 1% lidocaine.  Under ultrasound guidance, I cannulated the left small saphenous vein with a micropuncture needle and a micropuncture sheath was introduced over a wire.  I then advanced the J-wire up into the small saphenous vein to the level where it started to dive deep to join the  popliteal vein.  The sheath was introduced over the wire and then the wire and dilator were removed.  The laser fiber was positioned at the end of the sheath and the sheath retracted.  The laser fiber was positioned in the small saphenous vein distal to where the vein started to dive to join the deep system.  Tumescent anesthesia was then administered circumferentially around the vein.  Laser ablation was performed of the left small saphenous vein using 50 J/cm at 7 W.  Attention was then turned to stab phlebectomies.  All the marked areas were anesthetized.  Using small stab incisions the veins were "brought above the skin and then gently excised.  Approximately 15 incisions were made.  Patient tolerated the procedure well.  She will return in 1 week for a follow-up duplex.  Waverly Ferrari [facility] [phone]

## 2022-09-20 NOTE — Progress Notes (Signed)
     Laser Ablation Procedure    Date: 09/20/2022   Nancy Walker DOB:Dec 30, 1965  Consent signed: Yes      Surgeon: Dr,Christopher Edilia Bo  Procedure: Laser Ablation: left Small Saphenous Vein  BP (!) 97/58 (BP Location: Left Arm, Patient Position: Sitting, Cuff Size: Normal)   Pulse (!) 51   Temp 98.2 F (36.8 C) (Temporal)   Resp 18   Ht 5\' 7"  (1.702 m)   Wt 140 lb (63.5 kg)   SpO2 99%   BMI 21.93 kg/m   Tumescent Anesthesia: 250 cc 0.9% NaCl with 50 cc Lidocaine HCL 1%  and 15 cc 8.4% NaHCO3  Local Anesthesia: 12 cc Lidocaine HCL and NaHCO3 (ratio 2:1)  7 watts continuous mode     Total energy: 683.5 joules    Total time: 97 sec Treatment Length 15cm   Laser Fiber Ref. # [id number]      Lot # [id number]   Stab Phlebectomy: 10-20 Sites: Calf  Patient tolerated procedure well  Notes:   All staff members wore facial masks.     Patient had (1) 1 mg tablet prior to procedure at 9:45 am and  another 1 mg  (1) tablet at 11:05 am.    Description of Procedure:  After marking the course of the secondary varicosities, the patient was placed on the operating table in the prone position, and the left leg was prepped and draped in sterile fashion.   Local anesthetic was administered and under ultrasound guidance the saphenous vein was accessed with a micro needle and guide wire; then the mirco puncture sheath was placed.  A guide wire was inserted saphenopopliteal junction , followed by a 5 french sheath.  The position of the sheath and then the laser fiber below the junction was confirmed using the ultrasound.  Tumescent anesthesia was administered along the course of the saphenous vein using ultrasound guidance. The patient was placed in Trendelenburg position and protective laser glasses were placed on patient and staff, and the laser was fired at 7 watts continuous mode for a total of 683.5 joules.   For stab phlebectomies, local anesthetic was administered at the previously  marked varicosities, and tumescent anesthesia was administered around the vessels.  Ten to 20 stab wounds were made using the tip of an 11 blade. And using the vein hook, the phlebectomies were performed using a hemostat to avulse the varicosities.  Adequate hemostasis was achieved.     Patient has history of adverse reaction to steristrips so none was applied.  Xeroform and ABD pads and thigh high compression stockings were applied.  Ace wrap bandages were applied over the phlebectomy sites and at the top of the saphenopopliteal junction. Blood loss was less than 15 cc.  Discharge instructions reviewed with patient and hardcopy of discharge instructions given to patient to take home. The patient ambulated out of the operating room having tolerated the procedure well.

## 2022-09-27 ENCOUNTER — Ambulatory Visit (HOSPITAL_COMMUNITY)
Admission: RE | Admit: 2022-09-27 | Discharge: 2022-09-27 | Disposition: A | Discharge status: Home / Self Care | Payer: Commercial Managed Care - HMO | Source: Ambulatory Visit | Attending: Vascular Surgery | Admitting: Vascular Surgery

## 2022-09-27 ENCOUNTER — Encounter: Payer: Self-pay | Admitting: Vascular Surgery

## 2022-09-27 ENCOUNTER — Ambulatory Visit (INDEPENDENT_AMBULATORY_CARE_PROVIDER_SITE_OTHER): Payer: Commercial Managed Care - HMO | Admitting: Vascular Surgery

## 2022-09-27 VITALS — BP 109/62 | HR 47 | Temp 97.3°F | Resp 18 | Ht 67.0 in

## 2022-09-27 DIAGNOSIS — I83812 Varicose veins of left lower extremities with pain: Secondary | ICD-10-CM | POA: Diagnosis not present

## 2022-09-27 DIAGNOSIS — I872 Venous insufficiency (chronic) (peripheral): Secondary | ICD-10-CM

## 2022-09-27 NOTE — Progress Notes (Signed)
   Patient name: Nancy Walker MRN: [id number] DOB: 02/04/1966 Sex: female  REASON FOR VISIT: Follow-up after laser ablation and stab phlebectomies  HPI: Nancy Walker is a 57 y.o. female with C2 venous disease.  She had painful varicose veins in her posterior left calf.  She had reflux in the popliteal vein in the deep system and also in the small saphenous vein.  Her left great saphenous vein had been previously ablated.  She underwent laser ablation of the left small saphenous vein and 10-20 stabs on 09/20/2022.  She comes in for a 1 week follow-up visit.  Today she has no specific complaints.  She has been resuming her normal activities.  She has been wearing her thigh-high compression stockings.  Current Outpatient Medications  Medication Sig Dispense Refill   alendronate (FOSAMAX) 70 MG tablet Take 70 mg by mouth every Sunday.     CALCIUM PO Take 1 tablet by mouth daily.     escitalopram (LEXAPRO) 10 MG tablet Take 10 mg by mouth daily.     SUMAtriptan (IMITREX) 50 MG tablet Take 50 mg by mouth every 2 (two) hours as needed for migraine.     No current facility-administered medications for this visit.   REVIEW OF SYSTEMS: Arly.Keller ] denotes positive finding; [  ] denotes negative finding  CARDIOVASCULAR:  [ ]  chest pain   [ ]  dyspnea on exertion  [ ]  leg swelling  CONSTITUTIONAL:  [ ]  fever   [ ]  chills  PHYSICAL EXAM: Vitals:   09/27/22 1047  BP: 109/62  Pulse: (!) 47  Resp: 18  Temp: (!) 97.3 F (36.3 C)  TempSrc: Temporal  SpO2: 99%  Height: 5\' 7"  (1.702 m)   GENERAL: The patient is a well-nourished female, in no acute distress. The vital signs are documented above. CARDIOVASCULAR: There is a regular rate and rhythm. PULMONARY: There is good air exchange bilaterally without wheezing or rales. VASCULAR: She has minimal bruising.  Her incisions are healing nicely.  DATA:  VENOUS DUPLEX: I have independently interpreted her venous duplex scan today.  This was of the left lower  extremity.  There was no evidence of DVT.  The small saphenous vein was successfully closed within 6 cm of the saphenous popliteal junction.  MEDICAL ISSUES:  S/P LASER ABLATION LEFT SMALL SAPHENOUS VEIN AND STAB PHLEBECTOMIES: Patient is doing well status post laser ablation of the left small saphenous vein with stab phlebectomies.  She has 1 more week with the thigh-high compression stocking.  I encouraged her to continue to elevate her legs.  She can resume her normal activities.  Will see her back as needed.  Waverly Ferrari [facility] [phone]

## 2022-11-02 ENCOUNTER — Encounter (HOSPITAL_COMMUNITY): Payer: Self-pay | Admitting: Emergency Medicine

## 2022-11-02 ENCOUNTER — Other Ambulatory Visit: Payer: Self-pay

## 2022-11-02 ENCOUNTER — Emergency Department (HOSPITAL_COMMUNITY)
Admission: EM | Admit: 2022-11-02 | Discharge: 2022-11-03 | Disposition: A | Discharge status: Home / Self Care | Payer: Commercial Managed Care - HMO | Source: Home / Self Care | Attending: Emergency Medicine | Admitting: Emergency Medicine

## 2022-11-02 DIAGNOSIS — D72829 Elevated white blood cell count, unspecified: Secondary | ICD-10-CM | POA: Diagnosis not present

## 2022-11-02 DIAGNOSIS — Z85038 Personal history of other malignant neoplasm of large intestine: Secondary | ICD-10-CM | POA: Insufficient documentation

## 2022-11-02 DIAGNOSIS — Z91040 Latex allergy status: Secondary | ICD-10-CM | POA: Insufficient documentation

## 2022-11-02 DIAGNOSIS — R1084 Generalized abdominal pain: Secondary | ICD-10-CM | POA: Insufficient documentation

## 2022-11-02 DIAGNOSIS — R1030 Lower abdominal pain, unspecified: Secondary | ICD-10-CM | POA: Diagnosis present

## 2022-11-02 DIAGNOSIS — E876 Hypokalemia: Secondary | ICD-10-CM | POA: Diagnosis not present

## 2022-11-02 LAB — CBC WITH DIFFERENTIAL/PLATELET
Abs Immature Granulocytes: 0.03 10*3/uL (ref 0.00–0.07)
Basophils Absolute: 0 10*3/uL (ref 0.0–0.1)
Basophils Relative: 0 %
Eosinophils Absolute: 0 10*3/uL (ref 0.0–0.5)
Eosinophils Relative: 0 %
HCT: 37.7 % (ref 36.0–46.0)
Hemoglobin: 12.7 g/dL (ref 12.0–15.0)
Immature Granulocytes: 0 %
Lymphocytes Relative: 9 %
Lymphs Abs: 1 10*3/uL (ref 0.7–4.0)
MCH: 29.8 pg (ref 26.0–34.0)
MCHC: 33.7 g/dL (ref 30.0–36.0)
MCV: 88.5 fL (ref 80.0–100.0)
Monocytes Absolute: 0.5 10*3/uL (ref 0.1–1.0)
Monocytes Relative: 5 %
Neutro Abs: 9.7 10*3/uL — ABNORMAL HIGH (ref 1.7–7.7)
Neutrophils Relative %: 86 %
Platelets: 191 10*3/uL (ref 150–400)
RBC: 4.26 MIL/uL (ref 3.87–5.11)
RDW: 13.2 % (ref 11.5–15.5)
WBC: 11.4 10*3/uL — ABNORMAL HIGH (ref 4.0–10.5)
nRBC: 0 % (ref 0.0–0.2)

## 2022-11-02 MED ORDER — SODIUM CHLORIDE 0.9 % IV BOLUS (SEPSIS)
1000.0000 mL | Freq: Once | INTRAVENOUS | Status: AC
  Administered 2022-11-03: 1000 mL via INTRAVENOUS

## 2022-11-02 MED ORDER — ONDANSETRON HCL 4 MG/2ML IJ SOLN
4.0000 mg | Freq: Once | INTRAMUSCULAR | Status: AC
  Administered 2022-11-03: 4 mg via INTRAVENOUS
  Filled 2022-11-02: qty 2

## 2022-11-02 NOTE — ED Provider Notes (Signed)
[place] EMERGENCY DEPARTMENT AT [facility] Provider Note   CSN: [id number] Arrival date & time: 11/02/22  2251     History  Chief Complaint  Patient presents with   Abdominal Pain   Migraine    Nancy Walker is a 57 y.o. female.  The history is provided by the patient and the spouse.  Patient with previous history of colon cancer that required colectomy, previous history of head trauma presents with abdominal pain.  Patient reports around 5 PM she began having lower abdominal pain and associated nonbloody emesis.  She had a large bowel movement but denies diarrhea.  She reports her abdominal pain is now improving.  No chest pain or shortness of breath.  She recently received a Tdap booster & thought that might be causing her symptoms. Patient reports mild headache that is improving.  No recent head trauma, no recent syncope   Past Medical History:  Diagnosis Date   Anxiety    Bradycardia    Depression     Home Medications Prior to Admission medications   Medication Sig Start Date End Date Taking? Authorizing Provider  alendronate (FOSAMAX) 70 MG tablet Take 70 mg by mouth every Sunday.    [provider]  CALCIUM PO Take 1 tablet by mouth daily.    [provider]  escitalopram (LEXAPRO) 10 MG tablet Take 10 mg by mouth daily.    [provider]  SUMAtriptan (IMITREX) 50 MG tablet Take 50 mg by mouth every 2 (two) hours as needed for migraine.    [provider]      Allergies    Latex, Penicillins, and Steri-strip compound benzoin [benzoin compound]    Review of Systems   Review of Systems  Constitutional:  Negative for fever.  Respiratory:  Negative for shortness of breath.   Cardiovascular:  Negative for chest pain.  Gastrointestinal:  Positive for abdominal pain, nausea and vomiting.  Genitourinary:  Negative for dysuria and vaginal bleeding.    Physical Exam Updated Vital Signs BP (!) 102/59   Pulse (!) 46    Temp 98 F (36.7 C)   Resp 16   Ht 1.702 m (5\' 7" )   Wt 61.2 kg   SpO2 99%   BMI 21.14 kg/m  Physical Exam CONSTITUTIONAL: Well developed/well nourished HEAD: Normocephalic/atraumatic EYES: EOMI/PERRL, no icterus ENMT: Mucous membranes moist NECK: supple no meningeal signs SPINE/BACK:entire spine nontender CV: S1/S2 noted, no murmurs/rubs/gallops noted LUNGS: Lungs are clear to auscultation bilaterally, no apparent distress ABDOMEN: soft, nontender, no rebound or guarding, bowel sounds noted throughout abdomen, no distention GU:no cva tenderness NEURO: Pt is awake/alert/appropriate, moves all extremitiesx4.  No facial droop.  No arm or leg drift.  Normal mental status, conversant EXTREMITIES: pulses normal/equal, full ROM SKIN: warm, color normal PSYCH: Flat affect ED Results / Procedures / Treatments   Labs (all labs ordered are listed, but only abnormal results are displayed) Labs Reviewed  CBC WITH DIFFERENTIAL/PLATELET - Abnormal; Notable for the following components:      Result Value   WBC 11.4 (*)    Neutro Abs 9.7 (*)    All other components within normal limits  URINALYSIS, ROUTINE W REFLEX MICROSCOPIC - Abnormal; Notable for the following components:   Ketones, ur 5 (*)    Leukocytes,Ua MODERATE (*)    All other components within normal limits  COMPREHENSIVE METABOLIC PANEL - Abnormal; Notable for the following components:   Sodium 134 (*)    Potassium 3.1 (*)  Glucose, Bld 120 (*)    Calcium 8.4 (*)    Total Protein 6.3 (*)    All other components within normal limits  LIPASE, BLOOD    EKG ED ECG REPORT   Date: 11/02/2022 2300  Rate: 44  Rhythm: sinus bradycardia  QRS Axis: normal  Intervals: normal  ST/T Wave abnormalities: normal  Conduction Disutrbances:none  Narrative Interpretation:   Old EKG Reviewed: unchanged  I have personally reviewed the EKG tracing and agree with the computerized printout as noted.  Radiology CT ABDOMEN PELVIS W  CONTRAST  Result Date: 11/03/2022 CLINICAL DATA:  Abdominal pain. EXAM: CT ABDOMEN AND PELVIS WITH CONTRAST TECHNIQUE: Multidetector CT imaging of the abdomen and pelvis was performed using the standard protocol following bolus administration of intravenous contrast. RADIATION DOSE REDUCTION: This exam was performed according to the departmental dose-optimization program which includes automated exposure control, adjustment of the mA and/or kV according to patient size and/or use of iterative reconstruction technique. CONTRAST:  75mL OMNIPAQUE IOHEXOL 350 MG/ML SOLN COMPARISON:  06/30/2022 FINDINGS: Lower Chest: Normal. Hepatobiliary: Normal hepatic contours. No intra- or extrahepatic biliary dilatation. The gallbladder is normal. Pancreas: Normal pancreas. No ductal dilatation or peripancreatic fluid collection. Spleen: Normal. Adrenals/Urinary Tract: The adrenal glands are normal. No hydronephrosis, nephroureterolithiasis or solid renal mass. Urinary bladder is distended. Stomach/Bowel: There is no hiatal hernia. Normal duodenal course and caliber. No small bowel dilatation or inflammation. Remote proximal colectomy. Normal appendix. Vascular/Lymphatic: Normal course and caliber of the major abdominal vessels. No abdominal or pelvic lymphadenopathy. Reproductive: Normal uterus. No adnexal mass. Other: None. Musculoskeletal: No bony spinal canal stenosis or focal osseous abnormality. IMPRESSION: 1. No acute abnormality of the abdomen or pelvis. 2. Distended urinary bladder. Electronically Signed   By: Deatra Robinson M.D.   On: 11/03/2022 03:07    Procedures Procedures    Medications Ordered in ED Medications  sodium chloride 0.9 % bolus 1,000 mL (0 mLs Intravenous Stopped 11/03/22 0238)  ondansetron (ZOFRAN) injection 4 mg (4 mg Intravenous Given 11/03/22 0000)  sodium chloride 0.9 % bolus 1,000 mL (1,000 mLs Intravenous New Bag/Given 11/03/22 0241)  iohexol (OMNIPAQUE) 350 MG/ML injection 75 mL (75 mLs  Intravenous Contrast Given 11/03/22 0218)    ED Course/ Medical Decision Making/ A&P Clinical Course as of 11/03/22 0358  Thu Nov 02, 2022  2318 Patient reports onset of abdominal pain that is now improving.  She does have previous history of colon CA that required colectomy.  She has done well since that time.  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  She is on no beta-blockers or calcium channel blocker [DW]  Fri Nov 03, 2022  0101 Potassium(!): 3.1 Mild hypokalemia [DW]  0123 Patient still having lower abdominal discomfort.  Given her previous history of colectomy, we will proceed with CT abdomen pelvis [DW]  0357 No acute intra-abdominal emergency noted on CT imaging Patient feels improved. She will be discharged home, no other acute complaints Reports her headache is resolved [DW]    Clinical Course User Index [DW] Zadie Rhine, MD                                 Medical Decision Making Amount and/or Complexity of Data Reviewed Labs: ordered. Decision-making details documented in ED Course. Radiology: ordered.  Risk Prescription drug management.   This patient presents  to the ED for concern of abdominal pain, this involves an extensive number of treatment options, and is a complaint that carries with it a high risk of complications and morbidity.  The differential diagnosis includes but is not limited to cholecystitis, cholelithiasis, pancreatitis, gastritis, peptic ulcer disease, appendicitis, bowel obstruction, bowel perforation, diverticulitis, AAA, ischemic bowel, ACS   Comorbidities that complicate the patient evaluation: Patient's presentation is complicated by their history of previous head trauma, colon CA   Additional history obtained: Additional history obtained from spouse Records reviewed previous admission documents  Lab Tests: I Ordered, and personally  interpreted labs.  The pertinent results include: Mild leukocytosis  Imaging Studies ordered: I ordered imaging studies including CT scan abdomen pelvis   I independently visualized and interpreted imaging which showed no acute findings I agree with the radiologist interpretation  Medicines ordered and prescription drug management: I ordered medication including zofran  for nausea  Reevaluation of the patient after these medicines showed that the patient    improved  Reevaluation: After the interventions noted above, I reevaluated the patient and found that they have :improved  Complexity of problems addressed: Patient's presentation is most consistent with  acute presentation with potential threat to life or bodily function  Disposition: After consideration of the diagnostic results and the patient's response to treatment,  I feel that the patent would benefit from discharge   .           Final Clinical Impression(s) / ED Diagnoses Final diagnoses:  Generalized abdominal pain    Rx / DC Orders ED Discharge Orders     None         Zadie Rhine, MD 11/03/22 [phone]

## 2022-11-02 NOTE — ED Triage Notes (Signed)
Patient BIB [facility] c/o lower abd pain that started after receiving Tdap booster.  Patient had one episode of vomiting.  Patient also endorses migraine symptoms, constipation x 3 months, and nausea.  88/50---110/60 42 HR 12 lead sinus brady 600 ml fluid 20 L ac

## 2022-11-03 ENCOUNTER — Emergency Department (HOSPITAL_COMMUNITY): Payer: Commercial Managed Care - HMO

## 2022-11-03 MED ORDER — SODIUM CHLORIDE 0.9 % IV BOLUS (SEPSIS)
1000.0000 mL | Freq: Once | INTRAVENOUS | Status: AC
  Administered 2022-11-03: 1000 mL via INTRAVENOUS

## 2022-11-03 MED ORDER — IOHEXOL 350 MG/ML SOLN
75.0000 mL | Freq: Once | INTRAVENOUS | Status: AC | PRN
  Administered 2022-11-03: 75 mL via INTRAVENOUS

## 2022-11-03 NOTE — Discharge Instructions (Signed)

## 2022-11-03 NOTE — ED Notes (Signed)
Pt provided with AVS.  Education complete; all questions answered.  Pt leaving ED in stable condition at this time with all belongings.

## 2022-11-03 NOTE — ED Notes (Signed)
Pt ambulatory to restroom with steady gait.

## 2023-02-16 ENCOUNTER — Ambulatory Visit
Admission: RE | Admit: 2023-02-16 | Discharge: 2023-02-16 | Disposition: A | Discharge status: Home / Self Care | Payer: 59 | Source: Ambulatory Visit | Attending: Obstetrics and Gynecology | Admitting: Obstetrics and Gynecology

## 2023-02-16 DIAGNOSIS — M810 Age-related osteoporosis without current pathological fracture: Secondary | ICD-10-CM
